# Patient Record
Sex: Female | Born: 1980 | Race: Black or African American | Hispanic: No | Marital: Married | Smoking: Never smoker
Health system: Southern US, Community
[De-identification: ages and names within clinical notes are randomized; demographics above are authoritative.]

## PROBLEM LIST (undated history)

## (undated) ENCOUNTER — Inpatient Hospital Stay (HOSPITAL_COMMUNITY): Payer: Self-pay

## (undated) DIAGNOSIS — K8042 Calculus of bile duct with acute cholecystitis without obstruction: Secondary | ICD-10-CM

## (undated) DIAGNOSIS — G43909 Migraine, unspecified, not intractable, without status migrainosus: Secondary | ICD-10-CM

## (undated) HISTORY — DX: Migraine, unspecified, not intractable, without status migrainosus: G43.909

## (undated) HISTORY — DX: Calculus of bile duct with acute cholecystitis without obstruction: K80.42

---

## 2012-04-05 NOTE — L&D Delivery Note (Signed)
Delivery Note At 9:34 PM a viable female was delivered via Vaginal, Spontaneous Delivery (Presentation: Left Occiput Anterior).  APGAR: 9, 9; weight 7 lb 9.2 oz (3435 g).   Placenta status: intact, Spontaneous.  Cord: 3 vessels with the following complications: None.  Cord pH: not sent.  Infant taken to warmer to be cleaned prior to mother holding.    Anesthesia: Local  Episiotomy: None Lacerations: 1st degree;Perineal Suture Repair: 3.0 vicryl Est. Blood Loss (mL): 200  Mom to postpartum.  Baby to nursery-stable.  Selena Lesser 01/03/2013, 10:01 PM

## 2012-04-05 NOTE — L&D Delivery Note (Signed)
I have seen and examined this patient and I agree with the above. Cam Hai 3:11 AM 01/04/2013

## 2012-06-07 ENCOUNTER — Encounter (HOSPITAL_COMMUNITY): Payer: Self-pay

## 2012-06-07 ENCOUNTER — Inpatient Hospital Stay (HOSPITAL_COMMUNITY)
Admission: AD | Admit: 2012-06-07 | Discharge: 2012-06-07 | Disposition: A | Discharge status: Home / Self Care | Payer: Medicaid Other | Source: Ambulatory Visit | Attending: Obstetrics & Gynecology | Admitting: Obstetrics & Gynecology

## 2012-06-07 DIAGNOSIS — O219 Vomiting of pregnancy, unspecified: Secondary | ICD-10-CM

## 2012-06-07 DIAGNOSIS — R51 Headache: Secondary | ICD-10-CM | POA: Insufficient documentation

## 2012-06-07 DIAGNOSIS — O210 Mild hyperemesis gravidarum: Secondary | ICD-10-CM | POA: Insufficient documentation

## 2012-06-07 LAB — URINALYSIS, ROUTINE W REFLEX MICROSCOPIC
Bilirubin Urine: NEGATIVE
Glucose, UA: NEGATIVE mg/dL
Ketones, ur: NEGATIVE mg/dL
pH: 6 (ref 5.0–8.0)

## 2012-06-07 LAB — URINE MICROSCOPIC-ADD ON

## 2012-06-07 MED ORDER — ONDANSETRON 8 MG PO TBDP: 8.0000 mg | ORAL_TABLET | Freq: Once | ORAL | Status: DC

## 2012-06-07 MED ORDER — PROMETHAZINE HCL 12.5 MG PO TABS: 12.5000 mg | ORAL_TABLET | Freq: Four times a day (QID) | ORAL | Status: DC | PRN

## 2012-06-07 MED ORDER — ONDANSETRON 8 MG PO TBDP
8.0000 mg | ORAL_TABLET | Freq: Once | ORAL | Status: AC
  Administered 2012-06-07: 8 mg via ORAL
  Filled 2012-06-07: qty 1

## 2012-06-07 NOTE — MAU Provider Note (Signed)
History     CSN: [id number]  Arrival date and time: 06/07/12 4098   First Provider Initiated Contact with Patient 06/07/12 1041      Chief Complaint  Patient presents with  . Emesis  . Headache   HPI Kristen Shaw is 32 y.o. [id number] [redacted]w[redacted]d weeks presenting with nausea and vomiting during this pregnancy.  She is "tired of it" She also has a headache because she has been unable eat since yesterday.  A friend has been giving her medication for nausea but she does not know the name of the medication.  She has hx of back pain which has worsened with pregnancy.  She denies abdominal pain and vaginal bleeding/discharge.  She needs list of doctors for prenatal care.   History reviewed. No pertinent past medical history.  History reviewed. No pertinent past surgical history.  History reviewed. No pertinent family history.  History  Substance Use Topics  . Smoking status: Never Smoker   . Smokeless tobacco: Not on file  . Alcohol Use: No    Allergies: No Known Allergies  Prescriptions prior to admission  Medication Sig Dispense Refill  . FOLIC ACID PO Take 1 tablet by mouth.        Review of Systems  Constitutional:       Unable to eat  Gastrointestinal: Positive for nausea and vomiting. Negative for abdominal pain.  Genitourinary:       Neg for vaginal bleeding or discharge  Musculoskeletal: Positive for back pain.  Neurological: Positive for headaches.   Physical Exam   Blood pressure 101/66, pulse 68, temperature 98.2 F (36.8 C), temperature source Oral, resp. rate 18, height 5\' 4"  (1.626 m), weight 161 lb (73.029 kg), last menstrual period 03/28/2012.  Physical Exam  Constitutional: She is oriented to person, place, and time. She appears well-developed and well-nourished. No distress.  HENT:  Head: Normocephalic.  Neck: Normal range of motion.  Cardiovascular: Normal rate.   Respiratory: Effort normal.  GI: Soft. There is tenderness (mild mid abdominal pain).  There is no rebound and no guarding.  Genitourinary:  Not indicated  Neurological: She is alert and oriented to person, place, and time.  Skin: Skin is warm and dry.  Psychiatric: She has a normal mood and affect. Her behavior is normal.   Results for orders placed during the hospital encounter of 06/07/12 (from the past 24 hour(s))  URINALYSIS, ROUTINE W REFLEX MICROSCOPIC     Status: Abnormal   Collection Time    06/07/12 10:10 AM      Result Value Range   Color, Urine YELLOW  YELLOW   APPearance CLEAR  CLEAR   Specific Gravity, Urine 1.020  1.005 - 1.030   pH 6.0  5.0 - 8.0   Glucose, UA NEGATIVE  NEGATIVE mg/dL   Hgb urine dipstick TRACE (*) NEGATIVE   Bilirubin Urine NEGATIVE  NEGATIVE   Ketones, ur NEGATIVE  NEGATIVE mg/dL   Protein, ur NEGATIVE  NEGATIVE mg/dL   Urobilinogen, UA 0.2  0.0 - 1.0 mg/dL   Nitrite NEGATIVE  NEGATIVE   Leukocytes, UA TRACE (*) NEGATIVE  URINE MICROSCOPIC-ADD ON     Status: Abnormal   Collection Time    06/07/12 10:10 AM      Result Value Range   Squamous Epithelial / LPF FEW (*) RARE   WBC, UA 0-2  <3 WBC/hpf   RBC / HPF 0-2  <3 RBC/hpf   Bacteria, UA RARE  RARE  POCT PREGNANCY, URINE  Status: Abnormal   Collection Time    06/07/12 10:18 AM      Result Value Range   Preg Test, Ur POSITIVE (*) NEGATIVE   MAU Course  Procedures  MDM   Used phone translator for HPI and discussed treatment of nausea and plans for prenatal care Will give Zofran 8mg  ODT now.    Assessment and Plan  A:  Nausea and vomiting in pregnancy     Headache  P:  Rx for phenergan tabs prn nausea/vomiting     List of clinics given     Discussed small frequent meals and use of tylenol prn for headaces    KEY,EVE M 06/07/2012, 10:41 AM

## 2012-06-07 NOTE — MAU Note (Signed)
I am 2 months preg, has been very nauseous.  Vomits everything she eats. Having headaches all the time.

## 2012-06-07 NOTE — MAU Note (Signed)
Has not taken anything for back pain or headaches.  Had back pain prior to pregnancy, worse now with preg "very severe"

## 2012-06-08 NOTE — MAU Provider Note (Signed)
Attestation of Attending Supervision of Advanced Practitioner (PA/CNM/NP): Evaluation and management procedures were performed by the Advanced Practitioner under my supervision and collaboration.  I have reviewed the Advanced Practitioner's note and chart, and I agree with the management and plan.  Anastasiya Gowin, MD, FACOG Attending Obstetrician & Gynecologist Faculty Practice, [facility]  

## 2012-07-12 ENCOUNTER — Encounter: Payer: Self-pay | Admitting: Family Medicine

## 2012-07-12 ENCOUNTER — Ambulatory Visit (INDEPENDENT_AMBULATORY_CARE_PROVIDER_SITE_OTHER): Payer: Medicaid Other | Admitting: Family Medicine

## 2012-07-12 ENCOUNTER — Other Ambulatory Visit: Payer: Self-pay | Admitting: Family Medicine

## 2012-07-12 ENCOUNTER — Other Ambulatory Visit (HOSPITAL_COMMUNITY)
Admission: RE | Admit: 2012-07-12 | Discharge: 2012-07-12 | Disposition: A | Discharge status: Home / Self Care | Payer: Medicaid Other | Source: Ambulatory Visit | Attending: Family Medicine | Admitting: Family Medicine

## 2012-07-12 VITALS — BP 98/64 | Temp 97.9°F | Wt 158.9 lb

## 2012-07-12 DIAGNOSIS — Z3480 Encounter for supervision of other normal pregnancy, unspecified trimester: Secondary | ICD-10-CM

## 2012-07-12 DIAGNOSIS — Z01419 Encounter for gynecological examination (general) (routine) without abnormal findings: Secondary | ICD-10-CM | POA: Insufficient documentation

## 2012-07-12 DIAGNOSIS — O9989 Other specified diseases and conditions complicating pregnancy, childbirth and the puerperium: Secondary | ICD-10-CM

## 2012-07-12 DIAGNOSIS — M545 Low back pain: Secondary | ICD-10-CM

## 2012-07-12 DIAGNOSIS — Z113 Encounter for screening for infections with a predominantly sexual mode of transmission: Secondary | ICD-10-CM | POA: Insufficient documentation

## 2012-07-12 DIAGNOSIS — O210 Mild hyperemesis gravidarum: Secondary | ICD-10-CM

## 2012-07-12 DIAGNOSIS — O219 Vomiting of pregnancy, unspecified: Secondary | ICD-10-CM

## 2012-07-12 DIAGNOSIS — Z3401 Encounter for supervision of normal first pregnancy, first trimester: Secondary | ICD-10-CM

## 2012-07-12 DIAGNOSIS — O0930 Supervision of pregnancy with insufficient antenatal care, unspecified trimester: Secondary | ICD-10-CM | POA: Insufficient documentation

## 2012-07-12 DIAGNOSIS — Z3492 Encounter for supervision of normal pregnancy, unspecified, second trimester: Secondary | ICD-10-CM

## 2012-07-12 DIAGNOSIS — R87810 Cervical high risk human papillomavirus (HPV) DNA test positive: Secondary | ICD-10-CM | POA: Insufficient documentation

## 2012-07-12 DIAGNOSIS — G43909 Migraine, unspecified, not intractable, without status migrainosus: Secondary | ICD-10-CM

## 2012-07-12 DIAGNOSIS — Z1151 Encounter for screening for human papillomavirus (HPV): Secondary | ICD-10-CM | POA: Insufficient documentation

## 2012-07-12 MED ORDER — BUTALBITAL-APAP-CAFFEINE 50-500-40 MG PO TABS: 1.0000 | ORAL_TABLET | ORAL | Status: DC | PRN

## 2012-07-12 MED ORDER — PROMETHAZINE HCL 12.5 MG PO TABS: 12.5000 mg | ORAL_TABLET | Freq: Four times a day (QID) | ORAL | Status: DC | PRN

## 2012-07-12 MED ORDER — ONDANSETRON 4 MG PO TBDP: 4.0000 mg | ORAL_TABLET | Freq: Four times a day (QID) | ORAL | Status: DC | PRN

## 2012-07-12 NOTE — Patient Instructions (Addendum)
Pregnancy - Second Trimester The second trimester of pregnancy (3 to 6 months) is a period of rapid growth for you and your baby. At the end of the sixth month, your baby is about 9 inches long and weighs 1 1/2 pounds. You will begin to feel the baby move between 18 and 20 weeks of the pregnancy. This is called quickening. Weight gain is faster. A clear fluid (colostrum) may leak out of your breasts. You may feel small contractions of the womb (uterus). This is known as false labor or Braxton-Hicks contractions. This is like a practice for labor when the baby is ready to be born. Usually, the problems with morning sickness have usually passed by the end of your first trimester. Some women develop small dark blotches (called cholasma, mask of pregnancy) on their face that usually goes away after the baby is born. Exposure to the sun makes the blotches worse. Acne may also develop in some pregnant women and pregnant women who have acne, may find that it goes away. PRENATAL EXAMS  Blood work may continue to be done during prenatal exams. These tests are done to check on your health and the probable health of your baby. Blood work is used to follow your blood levels (hemoglobin). Anemia (low hemoglobin) is common during pregnancy. Iron and vitamins are given to help prevent this. You will also be checked for diabetes between 24 and 28 weeks of the pregnancy. Some of the previous blood tests may be repeated.  The size of the uterus is measured during each visit. This is to make sure that the baby is continuing to grow properly according to the dates of the pregnancy.  Your blood pressure is checked every prenatal visit. This is to make sure you are not getting toxemia.  Your urine is checked to make sure you do not have an infection, diabetes or protein in the urine.  Your weight is checked often to make sure gains are happening at the suggested rate. This is to ensure that both you and your baby are growing  normally.  Sometimes, an ultrasound is performed to confirm the proper growth and development of the baby. This is a test which bounces harmless sound waves off the baby so your caregiver can more accurately determine due dates. Sometimes, a specialized test is done on the amniotic fluid surrounding the baby. This test is called an amniocentesis. The amniotic fluid is obtained by sticking a needle into the belly (abdomen). This is done to check the chromosomes in instances where there is a concern about possible genetic problems with the baby. It is also sometimes done near the end of pregnancy if an early delivery is required. In this case, it is done to help make sure the baby's lungs are mature enough for the baby to live outside of the womb. CHANGES OCCURING IN THE SECOND TRIMESTER OF PREGNANCY Your body goes through many changes during pregnancy. They vary from person to person. Talk to your caregiver about changes you notice that you are concerned about.  During the second trimester, you will likely have an increase in your appetite. It is normal to have cravings for certain foods. This varies from person to person and pregnancy to pregnancy.  Your lower abdomen will begin to bulge.  You may have to urinate more often because the uterus and baby are pressing on your bladder. It is also common to get more bladder infections during pregnancy (pain with urination). You can help this by   drinking lots of fluids and emptying your bladder before and after intercourse.  You may begin to get stretch marks on your hips, abdomen, and breasts. These are normal changes in the body during pregnancy. There are no exercises or medications to take that prevent this change.  You may begin to develop swollen and bulging veins (varicose veins) in your legs. Wearing support hose, elevating your feet for 15 minutes, 3 to 4 times a day and limiting salt in your diet helps lessen the problem.  Heartburn may develop  as the uterus grows and pushes up against the stomach. Antacids recommended by your caregiver helps with this problem. Also, eating smaller meals 4 to 5 times a day helps.  Constipation can be treated with a stool softener or adding bulk to your diet. Drinking lots of fluids, vegetables, fruits, and whole grains are helpful.  Exercising is also helpful. If you have been very active up until your pregnancy, most of these activities can be continued during your pregnancy. If you have been less active, it is helpful to start an exercise program such as walking.  Hemorrhoids (varicose veins in the rectum) may develop at the end of the second trimester. Warm sitz baths and hemorrhoid cream recommended by your caregiver helps hemorrhoid problems.  Backaches may develop during this time of your pregnancy. Avoid heavy lifting, wear low heal shoes and practice good posture to help with backache problems.  Some pregnant women develop tingling and numbness of their hand and fingers because of swelling and tightening of ligaments in the wrist (carpel tunnel syndrome). This goes away after the baby is born.  As your breasts enlarge, you may have to get a bigger bra. Get a comfortable, cotton, support bra. Do not get a nursing bra until the last month of the pregnancy if you will be nursing the baby.  You may get a dark line from your belly button to the pubic area called the linea nigra.  You may develop rosy cheeks because of increase blood flow to the face.  You may develop spider looking lines of the face, neck, arms and chest. These go away after the baby is born. HOME CARE INSTRUCTIONS   It is extremely important to avoid all smoking, herbs, alcohol, and unprescribed drugs during your pregnancy. These chemicals affect the formation and growth of the baby. Avoid these chemicals throughout the pregnancy to ensure the delivery of a healthy infant.  Most of your home care instructions are the same as  suggested for the first trimester of your pregnancy. Keep your caregiver's appointments. Follow your caregiver's instructions regarding medication use, exercise and diet.  During pregnancy, you are providing food for you and your baby. Continue to eat regular, well-balanced meals. Choose foods such as meat, fish, milk and other low fat dairy products, vegetables, fruits, and whole-grain breads and cereals. Your caregiver will tell you of the ideal weight gain.  A physical sexual relationship may be continued up until near the end of pregnancy if there are no other problems. Problems could include early (premature) leaking of amniotic fluid from the membranes, vaginal bleeding, abdominal pain, or other medical or pregnancy problems.  Exercise regularly if there are no restrictions. Check with your caregiver if you are unsure of the safety of some of your exercises. The greatest weight gain will occur in the last 2 trimesters of pregnancy. Exercise will help you:  Control your weight.  Get you in shape for labor and delivery.  Lose weight   after you have the baby.  Wear a good support or jogging bra for breast tenderness during pregnancy. This may help if worn during sleep. Pads or tissues may be used in the bra if you are leaking colostrum.  Do not use hot tubs, steam rooms or saunas throughout the pregnancy.  Wear your seat belt at all times when driving. This protects you and your baby if you are in an accident.  Avoid raw meat, uncooked cheese, cat litter boxes and soil used by cats. These carry germs that can cause birth defects in the baby.  The second trimester is also a good time to visit your dentist for your dental health if this has not been done yet. Getting your teeth cleaned is OK. Use a soft toothbrush. Brush gently during pregnancy.  It is easier to loose urine during pregnancy. Tightening up and strengthening the pelvic muscles will help with this problem. Practice stopping your  urination while you are going to the bathroom. These are the same muscles you need to strengthen. It is also the muscles you would use as if you were trying to stop from passing gas. You can practice tightening these muscles up 10 times a set and repeating this about 3 times per day. Once you know what muscles to tighten up, do not perform these exercises during urination. It is more likely to contribute to an infection by backing up the urine.  Ask for help if you have financial, counseling or nutritional needs during pregnancy. Your caregiver will be able to offer counseling for these needs as well as refer you for other special needs.  Your skin may become oily. If so, wash your face with mild soap, use non-greasy moisturizer and oil or cream based makeup. MEDICATIONS AND DRUG USE IN PREGNANCY  Take prenatal vitamins as directed. The vitamin should contain 1 milligram of folic acid. Keep all vitamins out of reach of children. Only a couple vitamins or tablets containing iron may be fatal to a baby or young child when ingested.  Avoid use of all medications, including herbs, over-the-counter medications, not prescribed or suggested by your caregiver. Only take over-the-counter or prescription medicines for pain, discomfort, or fever as directed by your caregiver. Do not use aspirin.  Let your caregiver also know about herbs you may be using.  Alcohol is related to a number of birth defects. This includes fetal alcohol syndrome. All alcohol, in any form, should be avoided completely. Smoking will cause low birth rate and premature babies.  Street or illegal drugs are very harmful to the baby. They are absolutely forbidden. A baby born to an addicted mother will be addicted at birth. The baby will go through the same withdrawal an adult does. SEEK MEDICAL CARE IF:  You have any concerns or worries during your pregnancy. It is better to call with your questions if you feel they cannot wait, rather  than worry about them. SEEK IMMEDIATE MEDICAL CARE IF:   An unexplained oral temperature above 102 F (38.9 C) develops, or as your caregiver suggests.  You have leaking of fluid from the vagina (birth canal). If leaking membranes are suspected, take your temperature and tell your caregiver of this when you call.  There is vaginal spotting, bleeding, or passing clots. Tell your caregiver of the amount and how many pads are used. Light spotting in pregnancy is common, especially following intercourse.  You develop a bad smelling vaginal discharge with a change in the color from clear   to white.  You continue to feel sick to your stomach (nauseated) and have no relief from remedies suggested. You vomit blood or coffee ground-like materials.  You lose more than 2 pounds of weight or gain more than 2 pounds of weight over 1 week, or as suggested by your caregiver.  You notice swelling of your face, hands, feet, or legs.  You get exposed to [place] measles and have never had them.  You are exposed to fifth disease or chickenpox.  You develop belly (abdominal) pain. Round ligament discomfort is a common non-cancerous (benign) cause of abdominal pain in pregnancy. Your caregiver still must evaluate you.  You develop a bad headache that does not go away.  You develop fever, diarrhea, pain with urination, or shortness of breath.  You develop visual problems, blurry, or double vision.  You fall or are in a car accident or any kind of trauma.  There is mental or physical violence at home. Document Released: 03/16/2001 Document Revised: 06/14/2011 Document Reviewed: 09/18/2008 [facility] Patient Information 2013 [place], [place].  Back Pain in Pregnancy Back pain during pregnancy is common. It happens in about half of all pregnancies. It is important for you and your baby that you remain active during your pregnancy.If you feel that back pain is not allowing you to remain active or sleep well,  it is time to see your caregiver. Back pain may be caused by several factors related to changes during your pregnancy.Fortunately, unless you had trouble with your back before your pregnancy, the pain is likely to get better after you deliver. Low back pain usually occurs between the fifth and seventh months of pregnancy. It can, however, happen in the first couple months. Factors that increase the risk of back problems include:   Previous back problems.  Injury to your back.  Having twins or multiple births.  A chronic cough.  Stress.  Job-related repetitive motions.  Muscle or spinal disease in the back.  Family history of back problems, ruptured (herniated) discs, or osteoporosis.  Depression, anxiety, and panic attacks. CAUSES   When you are pregnant, your body produces a hormone called relaxin. This hormonemakes the ligaments connecting the low back and pubic bones more flexible. This flexibility allows the baby to be delivered more easily. When your ligaments are loose, your muscles need to work harder to support your back. Soreness in your back can come from tired muscles. Soreness can also come from back tissues that are irritated since they are receiving less support.  As the baby grows, it puts pressure on the nerves and blood vessels in your pelvis. This can cause back pain.  As the baby grows and gets heavier during pregnancy, the uterus pushes the stomach muscles forward and changes your center of gravity. This makes your back muscles work harder to maintain good posture. SYMPTOMS  Lumbar pain during pregnancy Lumbar pain during pregnancy usually occurs at or above the waist in the center of the back. There may be pain and numbness that radiates into your leg or foot. This is similar to low back pain experienced by non-pregnant women. It usually increases with sitting for long periods of time, standing, or repetitive lifting. Tenderness may also be present in the muscles  along your upper back. Posterior pelvic pain during pregnancy Pain in the back of the pelvis is more common than lumbar pain in pregnancy. It is a deep pain felt in your side at the waistline, or across the tailbone (sacrum), or in both  places. You may have pain on one or both sides. This pain can also go into the buttocks and backs of the upper thighs. Pubic and groin pain may also be present. The pain does not quickly resolve with rest, and morning stiffness may also be present. Pelvic pain during pregnancy can be brought on by most activities. A high level of fitness before and during pregnancy may or may not prevent this problem. Labor pain is usually 1 to 2 minutes apart, lasts for about 1 minute, and involves a bearing down feeling or pressure in your pelvis. However, if you are at term with the pregnancy, constant low back pain can be the beginning of early labor, and you should be aware of this. DIAGNOSIS  X-rays of the back should not be done during the first 12 to 14 weeks of the pregnancy and only when absolutely necessary during the rest of the pregnancy. MRIs do not give off radiation and are safe during pregnancy. MRIs also should only be done when absolutely necessary. HOME CARE INSTRUCTIONS  Exercise as directed by your caregiver. Exercise is the most effective way to prevent or manage back pain. If you have a back problem, it is especially important to avoid sports that require sudden body movements. Swimming and walking are great activities.  Do not stand in one place for long periods of time.  Do not wear high heels.  Sit in chairs with good posture. Use a pillow on your lower back if necessary. Make sure your head rests over your shoulders and is not hanging forward.  Try sleeping on your side, preferably the left side, with a pillow or two between your legs. If you are sore after a night's rest, your bedmay betoo soft.Try placing a board between your mattress and box  spring.  Listen to your body when lifting.If you are experiencing pain, ask for help or try bending yourknees more so you can use your leg muscles rather than your back muscles. Squat down when picking up something from the floor. Do not bend over.  Eat a healthy diet. Try to gain weight within your caregiver's recommendations.  Use heat or cold packs 3 to 4 times a day for 15 minutes to help with the pain.  Only take over-the-counter or prescription medicines for pain, discomfort, or fever as directed by your caregiver. Sudden (acute) back pain  Use bed rest for only the most extreme, acute episodes of back pain. Prolonged bed rest over 48 hours will aggravate your condition.  Ice is very effective for acute conditions.  Put ice in a plastic bag.  Place a towel between your skin and the bag.  Leave the ice on for 10 to 20 minutes every 2 hours, or as needed.  Using heat packs for 30 minutes prior to activities is also helpful. Continued back pain See your caregiver if you have continued problems. Your caregiver can help or refer you for appropriate physical therapy. With conditioning, most back problems can be avoided. Sometimes, a more serious issue may be the cause of back pain. You should be seen right away if new problems seem to be developing. Your caregiver may recommend:  A maternity girdle.  An elastic sling.  A back brace.  A massage therapist or acupuncture. SEEK MEDICAL CARE IF:   You are not able to do most of your daily activities, even when taking the pain medicine you were given.  You need a referral to a physical therapist or chiropractor.  You want to try acupuncture. SEEK IMMEDIATE MEDICAL CARE IF:  You develop numbness, tingling, weakness, or problems with the use of your arms or legs.  You develop severe back pain that is no longer relieved with medicines.  You have a sudden change in bowel or bladder control.  You have increasing pain in other  areas of the body.  You develop shortness of breath, dizziness, or fainting.  You develop nausea, vomiting, or sweating.  You have back pain which is similar to labor pains.  You have back pain along with your water breaking or vaginal bleeding.  You have back pain or numbness that travels down your leg.  Your back pain developed after you fell.  You develop pain on one side of your back. You may have a kidney stone.  You see blood in your urine. You may have a bladder infection or kidney stone.  You have back pain with blisters. You may have shingles. Back pain is fairly common during pregnancy but should not be accepted as just part of the process. Back pain should always be treated as soon as possible. This will make your pregnancy as pleasant as possible. Document Released: 06/30/2005 Document Revised: 06/14/2011 Document Reviewed: 08/11/2010 [facility] Patient Information 2013 [place], [place].  Migraine Headache A migraine headache is an intense, throbbing pain on one or both sides of your head. A migraine can last for 30 minutes to several hours. CAUSES  The exact cause of a migraine headache is not always known. However, a migraine may be caused when nerves in the brain become irritated and release chemicals that cause inflammation. This causes pain. SYMPTOMS  Pain on one or both sides of your head.  Pulsating or throbbing pain.  Severe pain that prevents daily activities.  Pain that is aggravated by any physical activity.  Nausea, vomiting, or both.  Dizziness.  Pain with exposure to bright lights, loud noises, or activity.  General sensitivity to bright lights, loud noises, or smells. Before you get a migraine, you may get warning signs that a migraine is coming (aura). An aura may include:  Seeing flashing lights.  Seeing bright spots, halos, or zig-zag lines.  Having tunnel vision or blurred vision.  Having feelings of numbness or tingling.  Having  trouble talking.  Having muscle weakness. MIGRAINE TRIGGERS  Alcohol.  Smoking.  Stress.  Menstruation.  Aged cheeses.  Foods or drinks that contain nitrates, glutamate, aspartame, or tyramine.  Lack of sleep.  Chocolate.  Caffeine.  Hunger.  Physical exertion.  Fatigue.  Medicines used to treat chest pain (nitroglycerine), birth control pills, estrogen, and some blood pressure medicines. DIAGNOSIS  A migraine headache is often diagnosed based on:  Symptoms.  Physical examination.  A CT scan or MRI of your head. TREATMENT Medicines may be given for pain and nausea. Medicines can also be given to help prevent recurrent migraines.  HOME CARE INSTRUCTIONS  Only take over-the-counter or prescription medicines for pain or discomfort as directed by your caregiver. The use of long-term narcotics is not recommended.  Lie down in a dark, quiet room when you have a migraine.  Keep a journal to find out what may trigger your migraine headaches. For example, write down:  What you eat and drink.  How much sleep you get.  Any change to your diet or medicines.  Limit alcohol consumption.  Quit smoking if you smoke.  Get 7 to 9 hours of sleep, or as recommended by your caregiver.  Limit stress.  Keep lights dim if bright lights bother you and make your migraines worse. SEEK IMMEDIATE MEDICAL CARE IF:   Your migraine becomes severe.  You have a fever.  You have a stiff neck.  You have vision loss.  You have muscular weakness or loss of muscle control.  You start losing your balance or have trouble walking.  You feel faint or pass out.  You have severe symptoms that are different from your first symptoms. MAKE SURE YOU:   Understand these instructions.  Will watch your condition.  Will get help right away if you are not doing well or get worse. Document Released: 03/22/2005 Document Revised: 06/14/2011 Document Reviewed: 03/12/2011 [facility]  Patient Information 2013 [place], [place].

## 2012-07-12 NOTE — Progress Notes (Signed)
Pt. C/o back pain 

## 2012-07-12 NOTE — Progress Notes (Signed)
  Subjective:    Kristen Shaw is being seen today for her first obstetrical visit.  This is not a planned pregnancy. She moved from [place] (originally from [place]) 3 months ago and was unable to obtain contraception due to lack of insurance and medical care. She is at [redacted]w[redacted]d gestation by her LMP (approx 03/28/12). Her obstetrical history is significant for no risk factors. Relationship with FOB: spouse, living together. Patient does intend to breast feed. Pregnancy history fully reviewed.   Patient reports backache, headache, nausea, no bleeding, no contractions, no cramping, no leaking and vomiting. Seen in MAU for nausea/vomiting on 06/07/12 and given phenergan. It does not help for long - only a few hours. Not able to keep much food down. Denies heartburn, diarrhea or constipation. Has hx of migraines and having daily headache, left sided, associated with nausea, no vision changes. Tylenol not working. Also having low back pain, worsening over last few months.  This is 4th pregnancy. No complications of prior pregnancies or deliveries. All babies weighed 3.5-4.0 kg.  Past medical, surgical, OB, family and social history reviewed. Medications and allergies reviewed. Review of Systems:   Review of Systems Review of Systems - Negative except as stated in HPI  Objective:     BP 98/64  Temp(Src) 97.9 F (36.6 C)  Wt 158 lb 14.4 oz (72.077 kg)  BMI 27.26 kg/m2  LMP 03/28/2012 Physical Exam  Exam Physical Examination: General appearance - alert, well appearing, and in no distress Mental status - normal mood, behavior, speech, dress, motor activity, and thought processes Eyes - pupils equal and reactive, extraocular eye movements intact Neck - supple, no significant adenopathy, thyroid exam: thyroid is normal in size without nodules or tenderness Chest - clear to auscultation, no wheezes, rales or rhonchi, symmetric air entry Heart - normal rate, regular rhythm, normal S1, S2, no murmurs,  rubs, clicks or gallops Abdomen - soft, gravid (fundal height below umbilicus), non-tender, normal bowel sounds Pelvic - normal external genitalia, vulva, vagina, cervix, uterus and adnexa Neurological - alert, oriented, normal speech, no focal findings or movement disorder noted Extremities - peripheral pulses normal, no pedal edema, no clubbing or cyanosis   Assessment:    Pregnancy: [id number] Patient Active Problem List  Diagnosis  . Encounter for supervision of normal pregnancy in second trimester  . Migraines  . Pregnancy related low back pain, antepartum  . Nausea and vomiting in pregnancy prior to [redacted] weeks gestation       Plan:    Initial labs drawn. Prenatal vitamins. Problem list reviewed and updated. Zofran/phenergan for nausea Fioricet for headaches Discussed good back posture, exercises AFP3 discussed: requested. Quad screen ordered. Role of ultrasound in pregnancy discussed; fetal survey: requested. Amniocentesis discussed: not indicated. Follow up in 4 weeks. 50% of 45 min visit spent on counseling and coordination of care.   Napoleon Form 07/12/2012

## 2012-07-12 NOTE — Assessment & Plan Note (Signed)
Fioricet prescribed 07/12/12

## 2012-07-13 LAB — OBSTETRIC PANEL
Basophils Absolute: 0 10*3/uL (ref 0.0–0.1)
Basophils Relative: 0 % (ref 0–1)
Eosinophils Absolute: 0.1 10*3/uL (ref 0.0–0.7)
Hepatitis B Surface Ag: NEGATIVE
Lymphs Abs: 1.8 10*3/uL (ref 0.7–4.0)
MCH: 25.6 pg — ABNORMAL LOW (ref 26.0–34.0)
Neutrophils Relative %: 60 % (ref 43–77)
Platelets: 329 10*3/uL (ref 150–400)
RBC: 4.29 MIL/uL (ref 3.87–5.11)
WBC: 6 10*3/uL (ref 4.0–10.5)

## 2012-07-14 LAB — HEMOGLOBINOPATHY EVALUATION
Hemoglobin Other: 0 %
Hgb A2 Quant: 2.8 % (ref 2.2–3.2)
Hgb A: 96.9 % (ref 96.8–97.8)
Hgb F Quant: 0.3 % (ref 0.0–2.0)
Hgb S Quant: 0 %

## 2012-07-19 ENCOUNTER — Encounter: Payer: Self-pay | Admitting: *Deleted

## 2012-07-23 ENCOUNTER — Encounter: Payer: Self-pay | Admitting: Family Medicine

## 2012-07-27 ENCOUNTER — Ambulatory Visit: Payer: Self-pay | Admitting: Obstetrics & Gynecology

## 2012-07-28 ENCOUNTER — Encounter: Payer: Self-pay | Admitting: Obstetrics & Gynecology

## 2012-07-28 NOTE — Progress Notes (Signed)
Pt not seen.

## 2012-08-02 ENCOUNTER — Other Ambulatory Visit (INDEPENDENT_AMBULATORY_CARE_PROVIDER_SITE_OTHER): Payer: Medicaid Other | Admitting: *Deleted

## 2012-08-02 ENCOUNTER — Ambulatory Visit (HOSPITAL_COMMUNITY)
Admission: RE | Admit: 2012-08-02 | Discharge: 2012-08-02 | Disposition: A | Discharge status: Home / Self Care | Payer: Medicaid Other | Source: Ambulatory Visit | Attending: Family Medicine | Admitting: Family Medicine

## 2012-08-02 DIAGNOSIS — Z3401 Encounter for supervision of normal first pregnancy, first trimester: Secondary | ICD-10-CM

## 2012-08-02 DIAGNOSIS — O358XX0 Maternal care for other (suspected) fetal abnormality and damage, not applicable or unspecified: Secondary | ICD-10-CM | POA: Insufficient documentation

## 2012-08-02 DIAGNOSIS — Z1389 Encounter for screening for other disorder: Secondary | ICD-10-CM | POA: Insufficient documentation

## 2012-08-02 DIAGNOSIS — Z363 Encounter for antenatal screening for malformations: Secondary | ICD-10-CM | POA: Insufficient documentation

## 2012-08-02 DIAGNOSIS — O210 Mild hyperemesis gravidarum: Secondary | ICD-10-CM

## 2012-08-02 NOTE — Progress Notes (Signed)
Patient here for quad screen but per chart review has already been done- was normal- and does not need to be repeated. Verified with another nurse. Instructed patient we do not need to do quad screen since it has already been done. Also noted patient was also seeing another OB/GYN - she states she did but she has decided to continue with our clinic only. Patient voices understanding. Used Interpreter today.

## 2012-08-06 ENCOUNTER — Encounter: Payer: Self-pay | Admitting: Family Medicine

## 2012-08-09 ENCOUNTER — Ambulatory Visit (INDEPENDENT_AMBULATORY_CARE_PROVIDER_SITE_OTHER): Payer: Medicaid Other | Admitting: Advanced Practice Midwife

## 2012-08-09 VITALS — BP 95/67 | Temp 97.6°F | Wt 162.1 lb

## 2012-08-09 DIAGNOSIS — O219 Vomiting of pregnancy, unspecified: Secondary | ICD-10-CM

## 2012-08-09 DIAGNOSIS — O210 Mild hyperemesis gravidarum: Secondary | ICD-10-CM

## 2012-08-09 LAB — POCT URINALYSIS DIP (DEVICE)
Leukocytes, UA: NEGATIVE
Nitrite: NEGATIVE
Protein, ur: NEGATIVE mg/dL
Urobilinogen, UA: 0.2 mg/dL (ref 0.0–1.0)

## 2012-08-09 NOTE — Patient Instructions (Addendum)
Pregnancy - Second Trimester The second trimester is the period between 13 to 27 weeks of your pregnancy. It is important to follow your doctor's instructions. HOME CARE   Do not smoke.  Do not drink alcohol or use drugs.  Only take medicine as told by your doctor.  Take prenatal vitamins as told. The vitamin should contain 1 milligram of folic acid.  Exercise.  Eat healthy foods. Eat regular, well-balanced meals.  You can have sex (intercourse) if there are no other problems with the pregnancy.  Do not use hot tubs, steam rooms, or saunas.  Wear a seat belt while driving.  Avoid raw meat, uncooked cheese, and litter boxes and soil used by cats.  Visit your dentist. Shirlee Limerick are okay. GET HELP RIGHT AWAY IF:   You have a temperature by mouth above 102 F (38.9 C), not controlled by medicine.  Fluid is coming from your vagina.  Blood is coming from your vagina. Light spotting is common, especially after sex (intercourse).  You have a bad smelling fluid (discharge) coming from the vagina. The fluid changes from clear to white.  You still feel sick to your stomach (nauseous).  You throw up (vomit) blood.  You lose or gain more than 2 pounds (0.9 kilograms) of weight in a week, or as suggested by your doctor.  Your face, hands, feet, or legs get puffy (swell).  You get exposed to [place] measles and have never had them.  You get exposed to fifth disease or chickenpox.  You have belly (abdominal) pain.  You have a bad headache that will not go away.  You have watery poop (diarrhea), pain when you pee (urinate), or have shortness of breath.  You start to have problems seeing (blurry or double vision).  You fall, are in a car accident, or have any kind of trauma.  There is mental or physical violence at home.  You have any concerns or worries during your pregnancy. MAKE SURE YOU:   Understand these instructions.  Will watch your condition.  Will get help  right away if you are not doing well or get worse. Document Released: 06/16/2009 Document Revised: 06/14/2011 Document Reviewed: 06/16/2009 [facility] Patient Information 2013 [place], [place].  You are OK to take Tylenol (acetaminophen) in pregnancy.

## 2012-08-09 NOTE — Progress Notes (Signed)
Well, c/o some discomfort when the baby moves, but no regular pains, no bleeding. Rev'd precautions. Anatomy u/s and quad normal.

## 2012-08-09 NOTE — Progress Notes (Signed)
Pulse- 76  Pain-"cramping when baby moves"

## 2012-09-06 ENCOUNTER — Ambulatory Visit (INDEPENDENT_AMBULATORY_CARE_PROVIDER_SITE_OTHER): Payer: Medicaid Other | Admitting: Family

## 2012-09-06 VITALS — BP 93/61 | Temp 97.5°F | Wt 164.0 lb

## 2012-09-06 DIAGNOSIS — Z3492 Encounter for supervision of normal pregnancy, unspecified, second trimester: Secondary | ICD-10-CM

## 2012-09-06 DIAGNOSIS — O9989 Other specified diseases and conditions complicating pregnancy, childbirth and the puerperium: Secondary | ICD-10-CM

## 2012-09-06 LAB — POCT URINALYSIS DIP (DEVICE)
Glucose, UA: NEGATIVE mg/dL
Nitrite: NEGATIVE
Protein, ur: NEGATIVE mg/dL
Urobilinogen, UA: 0.2 mg/dL (ref 0.0–1.0)

## 2012-09-06 MED ORDER — CONCEPT DHA 53.5-38-1 MG PO CAPS: 1.0000 | ORAL_CAPSULE | Freq: Every day | ORAL | Status: DC

## 2012-09-06 NOTE — Progress Notes (Signed)
Informal US performed due to pt concern of not feeling FM.  Fetus in transverse, back down position. Gross body and limb movements visualized, fetal hiccups present and cardiac motion present. Pt watched [place] monitor during scan and felt reassured. Interpreter - Huntley Dec present during exam.

## 2012-09-06 NOTE — Progress Notes (Signed)
Pt concerned due to no fetal movement in two weeks; ultrasound with Diane to assess movement > growth [place] scheduled.

## 2012-09-06 NOTE — Progress Notes (Signed)
Pulse 81  States stopped feeling fetal movement since about 2 weeks ago. C/o lower back pain.

## 2012-09-08 ENCOUNTER — Ambulatory Visit (HOSPITAL_COMMUNITY)
Admission: RE | Admit: 2012-09-08 | Discharge: 2012-09-08 | Disposition: A | Discharge status: Home / Self Care | Payer: Medicaid Other | Source: Ambulatory Visit | Attending: Family | Admitting: Family

## 2012-09-08 DIAGNOSIS — Z3689 Encounter for other specified antenatal screening: Secondary | ICD-10-CM | POA: Insufficient documentation

## 2012-09-08 DIAGNOSIS — O3680X0 Pregnancy with inconclusive fetal viability, not applicable or unspecified: Secondary | ICD-10-CM | POA: Insufficient documentation

## 2012-09-08 DIAGNOSIS — Z3492 Encounter for supervision of normal pregnancy, unspecified, second trimester: Secondary | ICD-10-CM

## 2012-09-27 ENCOUNTER — Encounter: Payer: Medicaid Other | Admitting: Family Medicine

## 2012-10-11 ENCOUNTER — Ambulatory Visit (INDEPENDENT_AMBULATORY_CARE_PROVIDER_SITE_OTHER): Payer: Medicaid Other | Admitting: Family

## 2012-10-11 VITALS — BP 93/61 | Temp 97.8°F | Wt 166.0 lb

## 2012-10-11 DIAGNOSIS — O9989 Other specified diseases and conditions complicating pregnancy, childbirth and the puerperium: Secondary | ICD-10-CM

## 2012-10-11 DIAGNOSIS — Z3493 Encounter for supervision of normal pregnancy, unspecified, third trimester: Secondary | ICD-10-CM

## 2012-10-11 LAB — POCT URINALYSIS DIP (DEVICE)
Glucose, UA: NEGATIVE mg/dL
Nitrite: NEGATIVE
Protein, ur: NEGATIVE mg/dL
Urobilinogen, UA: 0.2 mg/dL (ref 0.0–1.0)

## 2012-10-11 LAB — GLUCOSE, CAPILLARY: Glucose-Capillary: 80 mg/dL (ref 70–99)

## 2012-10-11 NOTE — Progress Notes (Signed)
Pulse 81 Patient refuses 1 hour GCT for today states she is fasting for Ramadan. She agrees to do it on next OB visit.

## 2012-10-11 NOTE — Progress Notes (Signed)
No questions or concerns; pt declines 1 hr due to Ramadan; FBS today 80; repeat fasting at next visit in two weeks, then obtain 1 hr at the following visit.  Urine results not available at discharge.

## 2012-10-25 ENCOUNTER — Ambulatory Visit (INDEPENDENT_AMBULATORY_CARE_PROVIDER_SITE_OTHER): Payer: Medicaid Other | Admitting: Family

## 2012-10-25 VITALS — BP 101/63 | Temp 97.3°F | Wt 167.4 lb

## 2012-10-25 DIAGNOSIS — O9989 Other specified diseases and conditions complicating pregnancy, childbirth and the puerperium: Secondary | ICD-10-CM

## 2012-10-25 DIAGNOSIS — Z3492 Encounter for supervision of normal pregnancy, unspecified, second trimester: Secondary | ICD-10-CM

## 2012-10-25 LAB — POCT URINALYSIS DIP (DEVICE)
Bilirubin Urine: NEGATIVE
Glucose, UA: NEGATIVE mg/dL
Leukocytes, UA: NEGATIVE
Nitrite: NEGATIVE
Urobilinogen, UA: 0.2 mg/dL (ref 0.0–1.0)
pH: 7 (ref 5.0–8.0)

## 2012-10-25 NOTE — Progress Notes (Signed)
Pulse- 85  Pain-stomach

## 2012-10-25 NOTE — Progress Notes (Signed)
Pain on upper abdomen with fetal movement; no bleeding or leaking of fluid; provided reassurance; still fasting, will do FBS today (91) and 1 hr test in two weeks.

## 2012-11-08 ENCOUNTER — Encounter: Payer: Self-pay | Admitting: Advanced Practice Midwife

## 2012-12-01 ENCOUNTER — Ambulatory Visit (INDEPENDENT_AMBULATORY_CARE_PROVIDER_SITE_OTHER): Payer: Medicaid Other | Admitting: Obstetrics and Gynecology

## 2012-12-01 ENCOUNTER — Encounter: Payer: Self-pay | Admitting: Obstetrics and Gynecology

## 2012-12-01 VITALS — BP 88/63 | Temp 97.2°F | Wt 174.8 lb

## 2012-12-01 DIAGNOSIS — Z3483 Encounter for supervision of other normal pregnancy, third trimester: Secondary | ICD-10-CM

## 2012-12-01 DIAGNOSIS — O9989 Other specified diseases and conditions complicating pregnancy, childbirth and the puerperium: Secondary | ICD-10-CM

## 2012-12-01 LAB — POCT URINALYSIS DIP (DEVICE)
Bilirubin Urine: NEGATIVE
Glucose, UA: NEGATIVE mg/dL
Hgb urine dipstick: NEGATIVE
Nitrite: NEGATIVE
Urobilinogen, UA: 0.2 mg/dL (ref 0.0–1.0)
pH: 7 (ref 5.0–8.0)

## 2012-12-01 MED ORDER — CONCEPT DHA 53.5-38-1 MG PO CAPS: 1.0000 | ORAL_CAPSULE | Freq: Every day | ORAL | Status: DC

## 2012-12-01 NOTE — Progress Notes (Signed)
Pulse- 80 

## 2012-12-01 NOTE — Patient Instructions (Signed)

## 2012-12-01 NOTE — Progress Notes (Signed)
Dong well. Note for work to have frequent rests d/t/ LBP. GFM.

## 2012-12-13 ENCOUNTER — Ambulatory Visit (INDEPENDENT_AMBULATORY_CARE_PROVIDER_SITE_OTHER): Payer: Medicaid Other | Admitting: Family Medicine

## 2012-12-13 VITALS — BP 90/65 | Wt 173.8 lb

## 2012-12-13 DIAGNOSIS — O0930 Supervision of pregnancy with insufficient antenatal care, unspecified trimester: Secondary | ICD-10-CM

## 2012-12-13 DIAGNOSIS — Z3492 Encounter for supervision of normal pregnancy, unspecified, second trimester: Secondary | ICD-10-CM

## 2012-12-13 DIAGNOSIS — Z23 Encounter for immunization: Secondary | ICD-10-CM

## 2012-12-13 DIAGNOSIS — O9989 Other specified diseases and conditions complicating pregnancy, childbirth and the puerperium: Secondary | ICD-10-CM

## 2012-12-13 LAB — POCT URINALYSIS DIP (DEVICE)
Glucose, UA: NEGATIVE mg/dL
Hgb urine dipstick: NEGATIVE
Nitrite: NEGATIVE
Protein, ur: NEGATIVE mg/dL
Specific Gravity, Urine: 1.015 (ref 1.005–1.030)
Urobilinogen, UA: 0.2 mg/dL (ref 0.0–1.0)

## 2012-12-13 LAB — CBC
HCT: 32.6 % — ABNORMAL LOW (ref 36.0–46.0)
MCH: 25.6 pg — ABNORMAL LOW (ref 26.0–34.0)
MCHC: 33.1 g/dL (ref 30.0–36.0)
MCV: 77.3 fL — ABNORMAL LOW (ref 78.0–100.0)
Platelets: 299 10*3/uL (ref 150–400)
RDW: 16 % — ABNORMAL HIGH (ref 11.5–15.5)
WBC: 7.9 10*3/uL (ref 4.0–10.5)

## 2012-12-13 LAB — OB RESULTS CONSOLE GC/CHLAMYDIA: Chlamydia: NEGATIVE

## 2012-12-13 MED ORDER — TETANUS-DIPHTH-ACELL PERTUSSIS 5-2.5-18.5 LF-MCG/0.5 IM SUSP
0.5000 mL | Freq: Once | INTRAMUSCULAR | Status: AC
  Administered 2012-12-13: 0.5 mL via INTRAMUSCULAR

## 2012-12-13 NOTE — Progress Notes (Signed)
  Subjective:    Kristen Shaw is a 32 y.o. female being seen today for her obstetrical visit. She is at [redacted]w[redacted]d gestation. Patient reports no complaints. Fetal movement: normal.  Menstrual History: OB History   Grav Para Term Preterm Abortions TAB SAB Ect Mult Living   4 3 3       3        Patient's last menstrual period was 03/28/2012.    The following portions of the patient's history were reviewed and updated as appropriate: allergies, current medications, past family history, past medical history, past social history, past surgical history and problem list.  Review of Systems Pertinent items are noted in HPI.   Objective:    BP 90/65  Wt 173 lb 12.8 oz (78.835 kg)  BMI 29.82 kg/m2  LMP 03/28/2012 FHT: 125 BPM  Uterine Size: 38 cm and size equals dates  Presentations: unsure  Pelvic Exam:                          Assessment:   Kristen Shaw is a 32 y.o. [id number] at [redacted]w[redacted]d  here for ROB visit.    Discussed with Patient:  - Plans to breast feed.  All questions answered. - Continue prenatal vitamins. - Reviewed fetal kick counts Pt to perform daily at a time when the baby is active, lie laterally with both hands on belly in quiet room and count all movements (hiccups, shoulder rolls, obvious kicks, etc); pt is to report to clinic MAU for less than 10 movements felt in a 2 hour time period-pt told as soon as she counts 10 movements the count is complete.  - Routine precautions discussed (depression, infection s/s).   Patient provided with all pertinent phone numbers for emergencies. - RTC for any VB, regular, painful cramps/ctxs occurring at a rate of >2/10 min, fever (100.5 or higher), n/v/d, any pain that is unresolving or worsening, LOF, decreased fetal movement, CP, SOB, edema -RTC in one week for next visit.  Problems: Patient Active Problem List   Diagnosis Date Noted  . Encounter for supervision of normal pregnancy in second trimester 07/12/2012  .  Pregnancy related low back pain, antepartum 07/12/2012  . Nausea and vomiting in pregnancy prior to [redacted] weeks gestation 07/12/2012  . Migraines     To Do: 1. Tdap today 2. Glucose 1hr challenge 3. GBS today  [ ]  Vaccines: Flu:  Tdap: today [ ]  BCM:  [ ]  Readiness: baby has a place to sleep, car seat, other baby necessities.  Edu: [x ] PTL precautions; [ ]  BF class; [ ]  childbirth class; [ ]   BF counseling;

## 2012-12-13 NOTE — Patient Instructions (Addendum)

## 2012-12-13 NOTE — Progress Notes (Signed)
Pulse: 90 Needs Cultures today.

## 2012-12-14 ENCOUNTER — Encounter: Payer: Self-pay | Admitting: *Deleted

## 2012-12-14 ENCOUNTER — Encounter: Payer: Self-pay | Admitting: Obstetrics & Gynecology

## 2012-12-14 LAB — HIV ANTIBODY (ROUTINE TESTING W REFLEX): HIV: NONREACTIVE

## 2012-12-18 ENCOUNTER — Encounter: Payer: Self-pay | Admitting: Family Medicine

## 2012-12-20 ENCOUNTER — Encounter: Payer: Self-pay | Admitting: General Practice

## 2012-12-20 ENCOUNTER — Telehealth (HOSPITAL_COMMUNITY): Payer: Self-pay | Admitting: *Deleted

## 2012-12-20 ENCOUNTER — Ambulatory Visit (INDEPENDENT_AMBULATORY_CARE_PROVIDER_SITE_OTHER): Payer: Medicaid Other | Admitting: Obstetrics and Gynecology

## 2012-12-20 ENCOUNTER — Encounter: Payer: Self-pay | Admitting: Obstetrics and Gynecology

## 2012-12-20 ENCOUNTER — Encounter: Payer: Self-pay | Admitting: *Deleted

## 2012-12-20 VITALS — BP 96/65 | Temp 97.0°F | Wt 176.9 lb

## 2012-12-20 DIAGNOSIS — O0930 Supervision of pregnancy with insufficient antenatal care, unspecified trimester: Secondary | ICD-10-CM

## 2012-12-20 DIAGNOSIS — O9989 Other specified diseases and conditions complicating pregnancy, childbirth and the puerperium: Secondary | ICD-10-CM

## 2012-12-20 DIAGNOSIS — O0933 Supervision of pregnancy with insufficient antenatal care, third trimester: Secondary | ICD-10-CM

## 2012-12-20 DIAGNOSIS — O322XX1 Maternal care for transverse and oblique lie, fetus 1: Secondary | ICD-10-CM

## 2012-12-20 DIAGNOSIS — M545 Low back pain, unspecified: Secondary | ICD-10-CM

## 2012-12-20 DIAGNOSIS — O322XX0 Maternal care for transverse and oblique lie, not applicable or unspecified: Secondary | ICD-10-CM

## 2012-12-20 LAB — POCT URINALYSIS DIP (DEVICE)
Bilirubin Urine: NEGATIVE
Hgb urine dipstick: NEGATIVE
Nitrite: NEGATIVE
Protein, ur: NEGATIVE mg/dL
pH: 7 (ref 5.0–8.0)

## 2012-12-20 NOTE — Patient Instructions (Addendum)
Vaginal Delivery Your caregiver must first be sure you are in labor. Signs of labor include:  You may pass what is called "the mucus plug" before labor begins. This is a small amount of blood stained mucus.  Regular uterine contractions.  The time between contractions get closer together.  The discomfort and pain gradually gets more intense.  Pains are mostly located in the back.  Pains get worse when walking.  The cervix (the opening of the uterus) becomes thinner (begins to efface) and opens up (dilates). Once you are in labor and admitted into the hospital or care center, your caregiver will do the following:  A complete physical examination.  Check your vital signs (blood pressure, pulse, temperature and the fetal heart rate).  Do a vaginal examination (using a sterile glove and lubricant) to determine:  The position (presentation) of the baby (head [vertex] or buttock first).  The level (station) of the baby's head in the birth canal.  The effacement and dilatation of the cervix.  You may have your pubic hair shaved and be given an enema depending on your caregiver and the circumstance.  An electronic monitor is usually placed on your abdomen. The monitor follows the length and intensity of the contractions, as well as the baby's heart rate.  Usually, your caregiver will insert an IV in your arm with a bottle of sugar water. This is done as a precaution so that medications can be given to you quickly during labor or delivery. NORMAL LABOR AND DELIVERY IS DIVIDED UP INTO 3 STAGES: First Stage This is when regular contractions begin and the cervix begins to efface and dilate. This stage can last from 3 to 15 hours. The end of the first stage is when the cervix is 100% effaced and 10 centimeters dilated. Pain medications may be given by   Injection (morphine, demerol, etc.)  Regional anesthesia (spinal, caudal or epidural, anesthetics given in different locations of the  spine). Paracervical pain medication may be given, which is an injection of and anesthetic on each side of the cervix. A pregnant woman may request to have "Natural Childbirth" which is not to have any medications or anesthesia during her labor and delivery. Second Stage This is when the baby comes down through the birth canal (vagina) and is born. This can take 1 to 4 hours. As the baby's head comes down through the birth canal, you may feel like you are going to have a bowel movement. You will get the urge to bear down and push until the baby is delivered. As the baby's head is being delivered, the caregiver will decide if an episiotomy (a cut in the perineum and vagina area) is needed to prevent tearing of the tissue in this area. The episiotomy is sewn up after the delivery of the baby and placenta. Sometimes a mask with nitrous oxide is given for the mother to breath during the delivery of the baby to help if there is too much pain. The end of Stage 2 is when the baby is fully delivered. Then when the umbilical cord stops pulsating it is clamped and cut. Third Stage The third stage begins after the baby is completely delivered and ends after the placenta (afterbirth) is delivered. This usually takes 5 to 30 minutes. After the placenta is delivered, a medication is given either by intravenous or injection to help contract the uterus and prevent bleeding. The third stage is not painful and pain medication is usually not necessary. If an  episiotomy was done, it is repaired at this time. After the delivery, the mother is watched and monitored closely for 1 to 2 hours to make sure there is no postpartum bleeding (hemorrhage). If there is a lot of bleeding, medication is given to contract the uterus and stop the bleeding. Document Released: 12/30/2007 Document Revised: 12/15/2011 Document Reviewed: 12/30/2007 [facility] Patient Information 2014 [place], [place]. Round Ligament Pain The round ligament is made  up of muscle and fibrous tissue. It is attached to the uterus near the fallopian tube. The round ligament is located on both sides of the uterus and helps support the position of the uterus. It usually begins in the second trimester of pregnancy when the uterus comes out of the pelvis. The pain can come and go until the baby is delivered. Round ligament pain is not a serious problem and does not cause harm to the baby. CAUSE During pregnancy the uterus grows the most from the second trimester to delivery. As it grows, it stretches and slightly twists the round ligaments. When the uterus leans from one side to the other, the round ligament on the opposite side pulls and stretches. This can cause pain. SYMPTOMS  Pain can occur on one side or both sides. The pain is usually a short, sharp, and pinching-like. Sometimes it can be a dull, lingering and aching pain. The pain is located in the lower side of the abdomen or in the groin. The pain is internal and usually starts deep in the groin and moves up to the outside of the hip area. Pain can occur with:  Sudden change in position like getting out of bed or a chair.  Rolling over in bed.  Coughing or sneezing.  Walking too much.  Any type of physical activity. DIAGNOSIS  Your caregiver will make sure there are no serious problems causing the pain. When nothing serious is found, the symptoms usually indicate that the pain is from the round ligament. TREATMENT   Sit down and relax when the pain starts.  Flex your knees up to your belly.  Lay on your side with a pillow under your belly (abdomen) and another one between your legs.  Sit in a hot bath for 15 to 20 minutes or until the pain goes away. HOME CARE INSTRUCTIONS   Only take over-the-counter or prescriptions medicines for pain, discomfort or fever as directed by your caregiver.  Sit and stand slowly.  Avoid long walks if it causes pain.  Stop or lessen your physical activities if it  causes pain. SEEK MEDICAL CARE IF:   The pain does not go away with any of your treatment.  You need stronger medication for the pain.  You develop back pain that you did not have before with the side pain. SEEK IMMEDIATE MEDICAL CARE IF:   You develop a temperature of 102 F (38.9 C) or higher.  You develop uterine contractions.  You develop vaginal bleeding.  You develop nausea, vomiting or diarrhea.  You develop chills.  You have pain when you urinate. Document Released: 12/30/2007 Document Revised: 06/14/2011 Document Reviewed: 12/30/2007 [facility] Patient Information 2014 [place], [place].

## 2012-12-20 NOTE — Progress Notes (Signed)
P=84   [employer]

## 2012-12-20 NOTE — Telephone Encounter (Signed)
Preadmission screen  

## 2012-12-20 NOTE — Progress Notes (Signed)
RLP and LBP with walking discussed. Feels transverse> Diane to do [place] for presentation. >confirms back up transverse lie head to right. Through interpreter counseled and offered ECV which pt. Would like to try. Scheduled 9/19 at 0800.

## 2012-12-22 ENCOUNTER — Observation Stay (HOSPITAL_COMMUNITY)
Admission: RE | Admit: 2012-12-22 | Discharge: 2012-12-22 | Disposition: A | Discharge status: Home / Self Care | Payer: Medicaid Other | Source: Ambulatory Visit | Attending: Obstetrics & Gynecology | Admitting: Obstetrics & Gynecology

## 2012-12-22 ENCOUNTER — Encounter: Payer: Self-pay | Admitting: Obstetrics and Gynecology

## 2012-12-22 VITALS — BP 99/67 | HR 83 | Temp 98.5°F | Resp 18 | Ht 64.0 in | Wt 176.0 lb

## 2012-12-22 DIAGNOSIS — O0930 Supervision of pregnancy with insufficient antenatal care, unspecified trimester: Secondary | ICD-10-CM | POA: Insufficient documentation

## 2012-12-22 DIAGNOSIS — IMO0002 Reserved for concepts with insufficient information to code with codable children: Secondary | ICD-10-CM

## 2012-12-22 DIAGNOSIS — O321XX0 Maternal care for breech presentation, not applicable or unspecified: Principal | ICD-10-CM | POA: Insufficient documentation

## 2012-12-22 MED ORDER — TERBUTALINE SULFATE 1 MG/ML IJ SOLN
0.2500 mg | Freq: Once | INTRAMUSCULAR | Status: AC
  Administered 2012-12-22: 0.25 mg via SUBCUTANEOUS
  Filled 2012-12-22: qty 1

## 2012-12-22 MED ORDER — LACTATED RINGERS IV SOLN
INTRAVENOUS | Status: DC
  Administered 2012-12-22: 10:00:00 via INTRAVENOUS

## 2012-12-22 NOTE — Progress Notes (Signed)
Patient ID: Kristen Shaw, female   DOB: 1981-02-06, 32 y.o.   MRN: [id number]  Pt was admitted for version. Confirmed breech. Dr. Debroah Loop manually manipulated fetus with successful version on first attempt. Confirmed by [place]. Pt stable. Will monitor mother and fetus for 1 hr and if unchanged discharge home. Pt to follow up on Wednesday and will confirm infant maintains position.   Tawana Scale, MD OB Fellow

## 2012-12-22 NOTE — Progress Notes (Addendum)
Pt states through interpreter that she does not want ECV. Faculty notified will be up to see pt.

## 2012-12-22 NOTE — H&P (Signed)
Kristen Shaw is a 32 y.o. female presenting for ECV for breech. Maternal Medical History:  Reason for admission: Nausea. Breech presentation for ECV  Contractions: Frequency: irregular.   Perceived severity is mild.    Fetal activity: Last perceived fetal movement was within the past hour.    Prenatal complications: Late prenatal care    OB History   Grav Para Term Preterm Abortions TAB SAB Ect Mult Living   4 3 3       3      Past Medical History  Diagnosis Date  . Migraines    No past surgical history on file. Family History: family history is not on file. Social History:  reports that she has never smoked. She does not have any smokeless tobacco history on file. She reports that she does not drink alcohol or use illicit drugs.   Prenatal Transfer Tool  Maternal Diabetes: No Genetic Screening: Normal Maternal Ultrasounds/Referrals: Normal Fetal Ultrasounds or other Referrals:  None Maternal Substance Abuse:  No Significant Maternal Medications:  None Significant Maternal Lab Results:  None Other Comments:  None  Review of Systems  Constitutional: Negative for fever.  Respiratory: Negative for cough.   Gastrointestinal: Negative for nausea.  Genitourinary: Negative for dysuria.      Blood pressure 99/67, pulse 83, temperature 98.5 F (36.9 C), temperature source Oral, resp. rate 18, height 5\' 4"  (1.626 m), weight 176 lb (79.833 kg), last menstrual period 03/28/2012. Maternal Exam:  Uterine Assessment: Contraction strength is mild.  Contraction frequency is irregular.   Abdomen: Fetal presentation: breech gravid c/w dates, breech, confirmed by [place] at bedside, normal AFI subjectively  Introitus: not evaluated.   Cervix: not evaluated.   Physical Exam  Constitutional: She appears well-developed and well-nourished. No distress.  Respiratory: Effort normal. No respiratory distress.  GI: There is no tenderness.  Musculoskeletal: Normal range of motion.   Skin: Skin is warm and dry.  Psychiatric: She has a normal mood and affect. Her behavior is normal.    Prenatal labs: ABO, Rh: A/POS/-- (04/09 1035) Antibody: NEG (04/09 1035) Rubella: 6.78 (04/09 1035) RPR: NON REAC (09/10 1128)  HBsAg: NEGATIVE (04/09 1035)  HIV: NON REACTIVE (09/10 1128)  GBS: Negative (09/10 0000)   Assessment/Plan: Breech presentation in multiparous patient, [redacted]w[redacted]d ECV offered and explained via interpreter. Risk of pain, labor, fetal or maternal intolerance to the procedure, emergency delivery, subsequent need for cesarean delivery were discussed and her questions were answered.   Kristen Shaw 12/22/2012, 11:13 AM

## 2012-12-27 ENCOUNTER — Encounter: Payer: Self-pay | Admitting: *Deleted

## 2012-12-27 ENCOUNTER — Ambulatory Visit (INDEPENDENT_AMBULATORY_CARE_PROVIDER_SITE_OTHER): Payer: Medicaid Other | Admitting: Advanced Practice Midwife

## 2012-12-27 ENCOUNTER — Encounter: Payer: Self-pay | Admitting: Advanced Practice Midwife

## 2012-12-27 VITALS — BP 105/70 | Wt 176.8 lb

## 2012-12-27 DIAGNOSIS — O9989 Other specified diseases and conditions complicating pregnancy, childbirth and the puerperium: Secondary | ICD-10-CM

## 2012-12-27 DIAGNOSIS — M545 Low back pain: Secondary | ICD-10-CM

## 2012-12-27 LAB — POCT URINALYSIS DIP (DEVICE)
Bilirubin Urine: NEGATIVE
Ketones, ur: NEGATIVE mg/dL
Specific Gravity, Urine: 1.02 (ref 1.005–1.030)
pH: 7 (ref 5.0–8.0)

## 2012-12-27 NOTE — Progress Notes (Signed)
p88 Pain in lower abdomen and back

## 2012-12-27 NOTE — Progress Notes (Signed)
Doing well.  Good fetal movement, denies vaginal bleeding, LOF, regular contractions.  Reports constant pelvic pressure.  Does not desire cervical exam today. Reviewed signs of labor.

## 2013-01-03 ENCOUNTER — Inpatient Hospital Stay (HOSPITAL_COMMUNITY)
Admission: AD | Admit: 2013-01-03 | Discharge: 2013-01-05 | DRG: 775 | Disposition: A | Discharge status: Home / Self Care | Payer: Medicaid Other | Source: Ambulatory Visit | Attending: Family Medicine | Admitting: Family Medicine

## 2013-01-03 ENCOUNTER — Encounter: Payer: Medicaid Other | Admitting: Family

## 2013-01-03 ENCOUNTER — Encounter (HOSPITAL_COMMUNITY): Payer: Self-pay

## 2013-01-03 DIAGNOSIS — M545 Low back pain: Secondary | ICD-10-CM

## 2013-01-03 DIAGNOSIS — O0930 Supervision of pregnancy with insufficient antenatal care, unspecified trimester: Secondary | ICD-10-CM

## 2013-01-03 DIAGNOSIS — O0933 Supervision of pregnancy with insufficient antenatal care, third trimester: Secondary | ICD-10-CM

## 2013-01-03 LAB — CBC
HCT: 33.7 % — ABNORMAL LOW (ref 36.0–46.0)
MCHC: 33.2 g/dL (ref 30.0–36.0)
MCV: 77.5 fL — ABNORMAL LOW (ref 78.0–100.0)
RDW: 15.7 % — ABNORMAL HIGH (ref 11.5–15.5)
WBC: 9.2 10*3/uL (ref 4.0–10.5)

## 2013-01-03 LAB — RPR: RPR Ser Ql: NONREACTIVE

## 2013-01-03 LAB — POCT URINALYSIS DIP (DEVICE)
Ketones, ur: NEGATIVE mg/dL
Protein, ur: NEGATIVE mg/dL
Specific Gravity, Urine: 1.01 (ref 1.005–1.030)
pH: 7 (ref 5.0–8.0)

## 2013-01-03 MED ORDER — IBUPROFEN 600 MG PO TABS
600.0000 mg | ORAL_TABLET | Freq: Four times a day (QID) | ORAL | Status: DC | PRN
  Administered 2013-01-03: 600 mg via ORAL
  Filled 2013-01-03: qty 1

## 2013-01-03 MED ORDER — ZOLPIDEM TARTRATE 5 MG PO TABS: 5.0000 mg | ORAL_TABLET | Freq: Every evening | ORAL | Status: DC | PRN

## 2013-01-03 MED ORDER — ACETAMINOPHEN 325 MG PO TABS: 650.0000 mg | ORAL_TABLET | ORAL | Status: DC | PRN

## 2013-01-03 MED ORDER — DIPHENHYDRAMINE HCL 25 MG PO CAPS: 25.0000 mg | ORAL_CAPSULE | Freq: Four times a day (QID) | ORAL | Status: DC | PRN

## 2013-01-03 MED ORDER — BENZOCAINE-MENTHOL 20-0.5 % EX AERO
1.0000 "application " | INHALATION_SPRAY | CUTANEOUS | Status: DC | PRN
  Administered 2013-01-03 – 2013-01-05 (×2): 1 via TOPICAL
  Filled 2013-01-03 (×3): qty 56

## 2013-01-03 MED ORDER — LACTATED RINGERS IV SOLN: 500.0000 mL | INTRAVENOUS | Status: DC | PRN

## 2013-01-03 MED ORDER — ONDANSETRON HCL 4 MG/2ML IJ SOLN: 4.0000 mg | INTRAMUSCULAR | Status: DC | PRN

## 2013-01-03 MED ORDER — OXYTOCIN 40 UNITS IN LACTATED RINGERS INFUSION - SIMPLE MED
62.5000 mL/h | INTRAVENOUS | Status: DC
  Administered 2013-01-03: 62.5 mL/h via INTRAVENOUS
  Filled 2013-01-03: qty 1000

## 2013-01-03 MED ORDER — SENNOSIDES-DOCUSATE SODIUM 8.6-50 MG PO TABS
2.0000 | ORAL_TABLET | ORAL | Status: DC
  Administered 2013-01-04: 2 via ORAL

## 2013-01-03 MED ORDER — ONDANSETRON HCL 4 MG/2ML IJ SOLN: 4.0000 mg | Freq: Four times a day (QID) | INTRAMUSCULAR | Status: DC | PRN

## 2013-01-03 MED ORDER — OXYTOCIN BOLUS FROM INFUSION
500.0000 mL | INTRAVENOUS | Status: DC
  Administered 2013-01-03: 500 mL via INTRAVENOUS

## 2013-01-03 MED ORDER — LIDOCAINE HCL (PF) 1 % IJ SOLN
30.0000 mL | INTRAMUSCULAR | Status: DC | PRN
  Administered 2013-01-03: 30 mL via SUBCUTANEOUS
  Filled 2013-01-03 (×2): qty 30

## 2013-01-03 MED ORDER — FENTANYL CITRATE 0.05 MG/ML IJ SOLN
100.0000 ug | INTRAMUSCULAR | Status: DC | PRN
  Administered 2013-01-03 (×3): 100 ug via INTRAVENOUS
  Filled 2013-01-03 (×3): qty 2

## 2013-01-03 MED ORDER — IBUPROFEN 600 MG PO TABS
600.0000 mg | ORAL_TABLET | Freq: Four times a day (QID) | ORAL | Status: DC
  Administered 2013-01-04 – 2013-01-05 (×5): 600 mg via ORAL
  Filled 2013-01-03 (×7): qty 1

## 2013-01-03 MED ORDER — OXYCODONE-ACETAMINOPHEN 5-325 MG PO TABS: 1.0000 | ORAL_TABLET | ORAL | Status: DC | PRN

## 2013-01-03 MED ORDER — LANOLIN HYDROUS EX OINT: TOPICAL_OINTMENT | CUTANEOUS | Status: DC | PRN

## 2013-01-03 MED ORDER — ONDANSETRON HCL 4 MG PO TABS: 4.0000 mg | ORAL_TABLET | ORAL | Status: DC | PRN

## 2013-01-03 MED ORDER — OXYTOCIN 40 UNITS IN LACTATED RINGERS INFUSION - SIMPLE MED
1.0000 m[IU]/min | INTRAVENOUS | Status: DC
  Administered 2013-01-03: 2 m[IU]/min via INTRAVENOUS

## 2013-01-03 MED ORDER — TERBUTALINE SULFATE 1 MG/ML IJ SOLN: 0.2500 mg | Freq: Once | INTRAMUSCULAR | Status: DC | PRN

## 2013-01-03 MED ORDER — SIMETHICONE 80 MG PO CHEW: 80.0000 mg | CHEWABLE_TABLET | ORAL | Status: DC | PRN

## 2013-01-03 MED ORDER — OXYCODONE-ACETAMINOPHEN 5-325 MG PO TABS
1.0000 | ORAL_TABLET | ORAL | Status: DC | PRN
  Administered 2013-01-03: 1 via ORAL
  Administered 2013-01-04 – 2013-01-05 (×7): 2 via ORAL
  Filled 2013-01-03 (×6): qty 2
  Filled 2013-01-03: qty 1
  Filled 2013-01-03: qty 2

## 2013-01-03 MED ORDER — WITCH HAZEL-GLYCERIN EX PADS: 1.0000 "application " | MEDICATED_PAD | CUTANEOUS | Status: DC | PRN

## 2013-01-03 MED ORDER — TETANUS-DIPHTH-ACELL PERTUSSIS 5-2.5-18.5 LF-MCG/0.5 IM SUSP: 0.5000 mL | Freq: Once | INTRAMUSCULAR | Status: DC

## 2013-01-03 MED ORDER — PRENATAL MULTIVITAMIN CH
1.0000 | ORAL_TABLET | Freq: Every day | ORAL | Status: DC
  Administered 2013-01-04 – 2013-01-05 (×2): 1 via ORAL
  Filled 2013-01-03 (×3): qty 1

## 2013-01-03 MED ORDER — CITRIC ACID-SODIUM CITRATE 334-500 MG/5ML PO SOLN: 30.0000 mL | ORAL | Status: DC | PRN

## 2013-01-03 MED ORDER — DIBUCAINE 1 % RE OINT: 1.0000 "application " | TOPICAL_OINTMENT | RECTAL | Status: DC | PRN

## 2013-01-03 MED ORDER — LACTATED RINGERS IV SOLN
INTRAVENOUS | Status: DC
  Administered 2013-01-03 (×2): via INTRAVENOUS

## 2013-01-03 NOTE — H&P (Signed)
Kristen Shaw is a 32 y.o. female presenting for SROM and contractions. Maternal Medical History:  Reason for admission: Rupture of membranes and contractions.  Nausea.  Contractions: Onset was 2 days ago.   Frequency: regular.   Duration is approximately 2 minutes.   Perceived severity is moderate.    Fetal activity: Perceived fetal activity is normal.   Last perceived fetal movement was within the past hour.      OB History   Grav Para Term Preterm Abortions TAB SAB Ect Mult Living   4 3 3       3      Past Medical History  Diagnosis Date  . Migraines    History reviewed. No pertinent past surgical history. Family History: family history is not on file. Social History:  reports that she has never smoked. She does not have any smokeless tobacco history on file. She reports that she does not drink alcohol or use illicit drugs.   Prenatal Transfer Tool  Maternal Diabetes: No Genetic Screening: Normal Maternal Ultrasounds/Referrals: Normal Fetal Ultrasounds or other Referrals:  None Maternal Substance Abuse:  No Significant Maternal Medications:  None Significant Maternal Lab Results:  None Other Comments:  None  Review of Systems  Constitutional: Negative for fever and chills.  HENT: Negative for sore throat.   Respiratory: Negative for shortness of breath.   Cardiovascular: Negative for chest pain.  Gastrointestinal: Negative for nausea and vomiting.  Neurological: Negative for headaches.    Dilation: 6 Effacement (%): 70 Station: -2 Exam by:: Morrison Old RN Blood pressure 121/80, pulse 81, temperature 98.5 F (36.9 C), temperature source Oral, resp. rate 18, height 5\' 4"  (1.626 m), weight 79.833 kg (176 lb), last menstrual period 03/28/2012, SpO2 100.00%.   Fetal Exam Fetal Monitor Review: Baseline rate: 130.  Variability: moderate (6-25 bpm).   Pattern: variable decelerations.    Fetal State Assessment: Category II - tracings are  indeterminate.     Physical Exam  Constitutional: She appears well-developed and well-nourished. No distress.  Skin: She is not diaphoretic.  Psychiatric: She has a normal mood and affect.    Prenatal labs: ABO, Rh: A/POS/-- (04/09 1035) Antibody: NEG (04/09 1035) Rubella: 6.78 (04/09 1035) RPR: NON REAC (09/10 1128)  HBsAg: NEGATIVE (04/09 1035)  HIV: NON REACTIVE (09/10 1128)  GBS: Negative (09/10 0000)   Assessment/Plan:  Patient will be admitted for SROM. This is her 4th child. She prefers female providers and she was told that every effort will be made to get her one, but it could be a female provider if necessary.  I did not examine the patient out of request of preference for female providers.   Patient does not want want any pain control; she prefers natural delivery. Expectations of pain were discussed and she did not use pain management on her previous three pregnancies.  Her husband will be present to cut the cord.   Bing Plume 01/03/2013, 12:24 PM  Kristen Shaw is a 32 y.o. [id number] at [redacted]w[redacted]d with SROM #Labor: Will augment with no change #Pain: IV pain meds OK,  #FWB:Cat I #ID: GBS Neg #MOF: Breast #MOC:depo  I spoke with and examined patient and agree with PA-S's note and plan of care.  Tawana Scale, MD Ob Fellow 01/03/2013 4:32 PM

## 2013-01-03 NOTE — Progress Notes (Signed)
Dr Ike Bene notified of patient, tracing, ctx pattern, sve result and that patient is uncomfortable with contractions. Order to recheck cervix in an hour.

## 2013-01-03 NOTE — MAU Note (Signed)
Pt reports she has been having contractions x 3 days. Had some vaginal dishcharge yesterday. Good fetal movement reported.

## 2013-01-03 NOTE — Progress Notes (Signed)
Brittan Butterbaugh is a 32 y.o. [id number] at [redacted]w[redacted]d  admitted for active labor, rupture of membranes  Subjective: Tolerating ctx with pain  Objective: BP 120/78  Pulse 73  Temp(Src) 98.2 F (36.8 C) (Oral)  Resp 18  Ht 5\' 4"  (1.626 m)  Wt 79.833 kg (176 lb)  BMI 30.2 kg/m2  SpO2 100%  LMP 03/28/2012      FHT:  FHR: 150s bpm, variability: moderate,  accelerations:  Abscent,  decelerations:  Absent UC:   irregular, every 5-6 minutes SVE:   Dilation: 6.5 Effacement (%): 70 Station: -2 Exam by:: [employer]: Lab Results  Component Value Date   WBC 9.2 01/03/2013   HGB 11.2* 01/03/2013   HCT 33.7* 01/03/2013   MCV 77.5* 01/03/2013   PLT 273 01/03/2013    Assessment / Plan: no significant change. will start augmentation  Labor: start pit wihtout change in cervix in 4hrs Preeclampsia:  no signs or symptoms of toxicity Fetal Wellbeing:  Category I Pain Control:  Labor support without medications, Epidural and Fentanyl I/D:  n/a Anticipated MOD:  NSVD  Nitza Schmid RYAN 01/03/2013, 4:31 PM

## 2013-01-03 NOTE — MAU Note (Signed)
Patient is in for labor eval. She c/o ctx q23m and have mucus discharge. She denies vaginal bleeding or lof. She reports good fetal movement.

## 2013-01-04 ENCOUNTER — Encounter (HOSPITAL_COMMUNITY): Payer: Self-pay | Admitting: *Deleted

## 2013-01-04 MED ORDER — INFLUENZA VAC SPLIT QUAD 0.5 ML IM SUSP
0.5000 mL | INTRAMUSCULAR | Status: AC
  Administered 2013-01-05: 0.5 mL via INTRAMUSCULAR

## 2013-01-04 NOTE — Progress Notes (Signed)
I have seen and examined this patient and I agree with the above. Kristen Shaw 9:13 AM 01/04/2013

## 2013-01-04 NOTE — Progress Notes (Signed)
Post Partum Day #1 Subjective: no complaints, up ad lib, voiding, tolerating PO and + flatus. Breastfeeding, Plans DMPA for contraception  Objective: Blood pressure 96/49, pulse 60, temperature 97.6 F (36.4 C), temperature source Oral, resp. rate 18, height 5\' 4"  (1.626 m), weight 79.833 kg (176 lb), last menstrual period 03/28/2012, SpO2 100.00%, unknown if currently breastfeeding.  Physical Exam:  General: alert, cooperative and no distress Lochia: appropriate Uterine Fundus: firm DVT Evaluation: No evidence of DVT seen on physical exam. Negative Homan's sign.   Recent Labs  01/03/13 1225  HGB 11.2*  HCT 33.7*    Assessment/Plan: Plan for discharge tomorrow   LOS: 1 day   Selena Lesser 01/04/2013, 8:50 AM

## 2013-01-04 NOTE — Progress Notes (Signed)
UR chart review completed.  

## 2013-01-05 MED ORDER — LANOLIN HYDROUS EX OINT: 1.0000 "application " | TOPICAL_OINTMENT | CUTANEOUS | Status: DC | PRN

## 2013-01-05 MED ORDER — DIBUCAINE 1 % RE OINT: 1.0000 "application " | TOPICAL_OINTMENT | RECTAL | Status: DC | PRN

## 2013-01-05 MED ORDER — BENZOCAINE-MENTHOL 20-0.5 % EX AERO: 1.0000 "application " | INHALATION_SPRAY | CUTANEOUS | Status: DC | PRN

## 2013-01-05 MED ORDER — SENNOSIDES-DOCUSATE SODIUM 8.6-50 MG PO TABS: 2.0000 | ORAL_TABLET | ORAL | Status: DC

## 2013-01-05 MED ORDER — OXYCODONE-ACETAMINOPHEN 5-325 MG PO TABS: 1.0000 | ORAL_TABLET | ORAL | Status: DC | PRN

## 2013-01-05 MED ORDER — WITCH HAZEL-GLYCERIN EX PADS: 1.0000 "application " | MEDICATED_PAD | CUTANEOUS | Status: DC | PRN

## 2013-01-05 NOTE — Progress Notes (Deleted)
Patient ID: Kristen Shaw, female   DOB: Jun 23, 1980, 32 y.o.   MRN: [id number] Post Partum Day #2 Subjective: no complaints, up ad lib, voiding, tolerating PO and + flatus.   Objective: Blood pressure 114/73, pulse 67, temperature 97.6 F (36.4 C), temperature source Oral, resp. rate 18, height 5\' 4"  (1.626 m), weight 79.833 kg (176 lb), last menstrual period 03/28/2012, SpO2 100.00%, unknown if currently breastfeeding.  Physical Exam:  General: alert, cooperative and no distress Lochia: appropriate Uterine Fundus: firm DVT Evaluation: No evidence of DVT seen on physical exam. Negative Homan's sign.   Recent Labs  01/03/13 1225  HGB 11.2*  HCT 33.7*    Assessment/Plan: Discharge today (this evening d/t transportation) Breast feeding Depo provera shot for birth control   LOS: 2 days   Kristen Shaw 01/05/2013, 7:33 AM

## 2013-01-05 NOTE — Discharge Summary (Addendum)
Obstetric Discharge Summary Reason for Admission: rupture of membranes and and contractions Prenatal Procedures: ultrasound Intrapartum Procedures: spontaneous vaginal delivery Postpartum Procedures: none Complications-Operative and Postpartum: none Hemoglobin  Date Value Range Status  01/03/2013 11.2* 12.0 - 15.0 g/dL Final     HCT  Date Value Range Status  01/03/2013 33.7* 36.0 - 46.0 % Final    Physical Exam:  General: alert and cooperative Lochia: appropriate Uterine Fundus: firm Incision: None DVT Evaluation: No evidence of DVT seen on physical exam. Negative Homan's sign. No cords or calf tenderness.  Discharge Diagnoses: Term Pregnancy-delivered  Discharge Information: Date: 01/05/2013 Activity: unrestricted Diet: routine Medications: None Condition: stable Instructions: refer to practice specific booklet Discharge to: home Follow-up Information   Follow up with [facility]. Schedule an appointment as soon as possible for a visit in 4 weeks. (Make an appointmet with your OB for follow up at 4-6 weeks)    Specialty:  Obstetrics and Gynecology   Contact information:   [address] [place] [address] [phone]      Newborn Data: Live born female  Birth Weight: 7 lb 9.2 oz (3436 g) APGAR: 9, 9  Home with mother Breast feeding  Depo provera shot for birth control  Felix Pacini 01/05/2013, 10:52 AM  Pt presented with SROM on 10/1. Required augmentation with pitocin. Delivered evening of 10/1. NSVD with first degree tear. No PP complications. Discharged home with follow up in cinic.  I spoke with and examined patient and agree with resident's note and plan of care.  Tawana Scale, MD OB Fellow 01/05/2013 10:52 AM

## 2013-01-08 NOTE — Discharge Summary (Signed)
Attestation of Attending Supervision of Obstetric Fellow: Evaluation and management procedures were performed by the Obstetric Fellow under my supervision and collaboration.  I have reviewed the Obstetric Fellow's note and chart, and I agree with the management and plan.  Baylen Dea, MD, FACOG Attending Obstetrician & Gynecologist Faculty Practice, [facility]   

## 2013-01-08 NOTE — H&P (Signed)
Chart reviewed and agree with management and plan.  

## 2013-01-10 ENCOUNTER — Ambulatory Visit: Payer: Medicaid Other

## 2013-01-17 ENCOUNTER — Ambulatory Visit: Payer: Self-pay

## 2013-02-08 ENCOUNTER — Ambulatory Visit: Payer: Self-pay | Admitting: Family

## 2013-03-07 ENCOUNTER — Ambulatory Visit (INDEPENDENT_AMBULATORY_CARE_PROVIDER_SITE_OTHER): Payer: Medicaid Other | Admitting: Obstetrics and Gynecology

## 2013-03-07 ENCOUNTER — Encounter: Payer: Self-pay | Admitting: Obstetrics and Gynecology

## 2013-03-07 VITALS — BP 111/80 | HR 73 | Temp 97.3°F | Ht 64.0 in | Wt 173.6 lb

## 2013-03-07 DIAGNOSIS — Z30017 Encounter for initial prescription of implantable subdermal contraceptive: Secondary | ICD-10-CM

## 2013-03-07 DIAGNOSIS — Z3009 Encounter for other general counseling and advice on contraception: Secondary | ICD-10-CM

## 2013-03-07 DIAGNOSIS — Z01812 Encounter for preprocedural laboratory examination: Secondary | ICD-10-CM

## 2013-03-07 DIAGNOSIS — Z309 Encounter for contraceptive management, unspecified: Secondary | ICD-10-CM

## 2013-03-07 MED ORDER — ETONOGESTREL 68 MG ~~LOC~~ IMPL
68.0000 mg | DRUG_IMPLANT | Freq: Once | SUBCUTANEOUS | Status: AC
  Administered 2013-03-07: 68 mg via SUBCUTANEOUS

## 2013-03-07 NOTE — Progress Notes (Signed)
  Subjective:     Kristen Shaw is a 32 y.o. female [phone] who presents for a postpartum visit. She is 8 weeks postpartum following a spontaneous vaginal delivery. I have fully reviewed the prenatal and intrapartum course. The delivery was at 40.1 gestational weeks. Outcome: spontaneous vaginal delivery. Anesthesia: local. Postpartum course has been uncomplicated. Baby's course has been uncomplicated. Baby is feeding by breast. Not supplementing.  Bleeding no bleeding. Had a period at 40 days postpartum. Bowel function is normal. Bladder function is normal. Patient is not sexually active. Contraception method is abstinence. Postpartum depression screening: negative. Has mild mid-pain which she attributes to carrying the baby too much.   The following portions of the patient's history were reviewed and updated as appropriate: allergies, current medications, past family history, past medical history, past social history, past surgical history and problem list.  Review of Systems Pertinent items are noted in HPI.   Objective:    BP 111/80  Pulse 73  Temp(Src) 97.3 F (36.3 C) (Oral)  Ht 5\' 4"  (1.626 m)  Wt 78.744 kg (173 lb 9.6 oz)  BMI 29.78 kg/m2  Breastfeeding? Yes  General:  alert, cooperative and no distress   Breasts:  inspection negative, no nipple discharge or bleeding, no masses or nodularity palpable Observed breastfeeding  Lungs: clear to auscultation bilaterally  Heart:  regular rate and rhythm, S1, S2 normal, no murmur, click, rub or gallop  Abdomen: soft, non-tender; bowel sounds normal; no masses,  no organomegaly Large diastasis   Vulva:  not evaluated  Vagina: not evaluated  Cervix:  not evaluated  Corpus: not examined  Adnexa:  not evaluated  Rectal Exam: Not performed.        Assessment:     8wks postpartum exam. Pap smear not done at today's visit.   Plan:    1. Contraception: Nexplanon> Dr. Erin Fulling to insert at today's visit 2. Pap due 07/2013 3.  Follow up in: 5 months or as needed.  Recommend abdominal tightening exercises.

## 2013-03-07 NOTE — Patient Instructions (Signed)
Etonogestrel implant What is this medicine? ETONOGESTREL (et oh noe JES trel) is a contraceptive (birth control) device. It is used to prevent pregnancy. It can be used for up to 3 years. This medicine may be used for other purposes; ask your health care provider or pharmacist if you have questions. COMMON BRAND NAME(S): Implanon, Nexplanon  What should I tell my health care provider before I take this medicine? They need to know if you have any of these conditions: -abnormal vaginal bleeding -blood vessel disease or blood clots -cancer of the breast, cervix, or liver -depression -diabetes -gallbladder disease -headaches -heart disease or recent heart attack -high blood pressure -high cholesterol -kidney disease -liver disease -renal disease -seizures -tobacco smoker -an unusual or allergic reaction to etonogestrel, other hormones, anesthetics or antiseptics, medicines, foods, dyes, or preservatives -pregnant or trying to get pregnant -breast-feeding How should I use this medicine? This device is inserted just under the skin on the inner side of your upper arm by a health care professional. Talk to your pediatrician regarding the use of this medicine in children. Special care may be needed. Overdosage: If you think you've taken too much of this medicine contact a poison control center or emergency room at once. Overdosage: If you think you have taken too much of this medicine contact a poison control center or emergency room at once. NOTE: This medicine is only for you. Do not share this medicine with others. What if I miss a dose? This does not apply. What may interact with this medicine? Do not take this medicine with any of the following medications: -amprenavir -bosentan -fosamprenavir This medicine may also interact with the following medications: -barbiturate medicines for inducing sleep or treating seizures -certain medicines for fungal infections like ketoconazole and  itraconazole -griseofulvin -medicines to treat seizures like carbamazepine, felbamate, oxcarbazepine, phenytoin, topiramate -modafinil -phenylbutazone -rifampin -some medicines to treat HIV infection like atazanavir, indinavir, lopinavir, nelfinavir, tipranavir, ritonavir -St. John's wort This list may not describe all possible interactions. Give your health care provider a list of all the medicines, herbs, non-prescription drugs, or dietary supplements you use. Also tell them if you smoke, drink alcohol, or use illegal drugs. Some items may interact with your medicine. What should I watch for while using this medicine? This product does not protect you against HIV infection (AIDS) or other sexually transmitted diseases. You should be able to feel the implant by pressing your fingertips over the skin where it was inserted. Tell your doctor if you cannot feel the implant. What side effects may I notice from receiving this medicine? Side effects that you should report to your doctor or health care professional as soon as possible: -allergic reactions like skin rash, itching or hives, swelling of the face, lips, or tongue -breast lumps -changes in vision -confusion, trouble speaking or understanding -dark urine -depressed mood -general ill feeling or flu-like symptoms -light-colored stools -loss of appetite, nausea -right upper belly pain -severe headaches -severe pain, swelling, or tenderness in the abdomen -shortness of breath, chest pain, swelling in a leg -signs of pregnancy -sudden numbness or weakness of the face, arm or leg -trouble walking, dizziness, loss of balance or coordination -unusual vaginal bleeding, discharge -unusually weak or tired -yellowing of the eyes or skin Side effects that usually do not require medical attention (Report these to your doctor or health care professional if they continue or are bothersome.): -acne -breast pain -changes in  weight -cough -fever or chills -headache -irregular menstrual bleeding -itching, burning,   and vaginal discharge -pain or difficulty passing urine -sore throat This list may not describe all possible side effects. Call your doctor for medical advice about side effects. You may report side effects to FDA at 1-800-FDA-1088. Where should I keep my medicine? This drug is given in a hospital or clinic and will not be stored at home. NOTE: This sheet is a summary. It may not cover all possible information. If you have questions about this medicine, talk to your doctor, pharmacist, or health care provider.  2014, Elsevier/Gold Standard. (2011-09-27 15:37:45)  

## 2013-03-07 NOTE — Progress Notes (Signed)
Patient ID: Kristen Shaw, female   DOB: 12/22/1980, 32 y.o.   MRN: [id number] Patient given informed consent, she signed consent form. There was an interpreter present throughout the visit. I reviewed with her the indications and complications of Nexplanon and confirmed that this was her preferred method of contraception.  Pregnancy test was negative.  Appropriate time out taken.  Patient's left arm was prepped and draped in the usual sterile fashion.. The ruler used to measure and mark insertion area.  Patient was prepped with alcohol swab and then injected with 5 ml of 1 % lidocaine.  She was prepped with betadine, Nexplanon removed from packaging,  Device confirmed in needle, then inserted full length of needle and withdrawn per handbook instructions.  There was minimal blood loss.  Patient insertion site covered with guaze and a pressure bandage to reduce any bruising.  The patient tolerated the procedure well and was given post procedure instructions. Return in about one month for Nexplanon check.

## 2013-03-08 ENCOUNTER — Encounter: Payer: Self-pay | Admitting: *Deleted

## 2013-05-25 ENCOUNTER — Emergency Department (HOSPITAL_COMMUNITY)
Admission: EM | Admit: 2013-05-25 | Discharge: 2013-05-25 | Disposition: A | Discharge status: Home / Self Care | Payer: Medicaid Other | Source: Home / Self Care | Attending: Family Medicine | Admitting: Family Medicine

## 2013-05-25 ENCOUNTER — Encounter (HOSPITAL_COMMUNITY): Payer: Self-pay | Admitting: Emergency Medicine

## 2013-05-25 DIAGNOSIS — J029 Acute pharyngitis, unspecified: Secondary | ICD-10-CM

## 2013-05-25 DIAGNOSIS — J069 Acute upper respiratory infection, unspecified: Secondary | ICD-10-CM

## 2013-05-25 LAB — POCT RAPID STREP A: Streptococcus, Group A Screen (Direct): NEGATIVE

## 2013-05-25 MED ORDER — NAPROXEN 375 MG PO TABS: 375.0000 mg | ORAL_TABLET | Freq: Two times a day (BID) | ORAL | Status: DC | PRN

## 2013-05-25 NOTE — ED Notes (Signed)
reportedlu has had cough, ST for past few days; using tylenol for pain , minimal relief; breast feeding new baby, no menses at present

## 2013-05-25 NOTE — ED Provider Notes (Signed)
CSN: [id number]     Arrival date & time 05/25/13  1050 History   First MD Initiated Contact with Patient 05/25/13 1105     Chief Complaint  Patient presents with  . URI     (Consider location/radiation/quality/duration/timing/severity/associated sxs/prior Treatment) HPI Comments: Patient presents with 3 day history of sore throat, fever, rhinorrhea and nasal congestion without hx of cough. Also endorses tender bilateral anterior cervical lymphadenopathy. No difficulty breathing, speaking or swallowing. Denies N/V/D or known ill contacts.  Patient is non-smoker  The history is provided by the patient.    Past Medical History  Diagnosis Date  . Migraines    History reviewed. No pertinent past surgical history. History reviewed. No pertinent family history. History  Substance Use Topics  . Smoking status: Never Smoker   . Smokeless tobacco: Not on file  . Alcohol Use: No   OB History   Grav Para Term Preterm Abortions TAB SAB Ect Mult Living   4 4 4       4      Review of Systems  All other systems reviewed and are negative.      Allergies  Banana and Tomato  Home Medications   Current Outpatient Rx  Name  Route  Sig  Dispense  Refill  . naproxen (NAPROSYN) 375 MG tablet   Oral   Take 1 tablet (375 mg total) by mouth 2 (two) times daily as needed for mild pain, moderate pain or headache.   30 tablet   0   . Prenat-FeFum-FePo-FA-Omega 3 (CONCEPT DHA) 53.5-38-1 MG CAPS   Oral   Take 1 tablet by mouth daily.   30 capsule   4    BP 106/80  Pulse 90  Temp(Src) 98.1 F (36.7 C) (Oral)  Resp 18  SpO2 98%  Breastfeeding? Yes Physical Exam  Nursing note and vitals reviewed. Constitutional: She is oriented to person, place, and time. She appears well-developed and well-nourished. No distress.  HENT:  Head: Normocephalic and atraumatic.  Right Ear: Hearing, tympanic membrane, external ear and ear canal normal.  Left Ear: Hearing, tympanic membrane, external  ear and ear canal normal.  Nose: Nose normal.  Mouth/Throat: Uvula is midline, oropharynx is clear and moist and mucous membranes are normal. No oral lesions. No trismus in the jaw. No uvula swelling.  Eyes: Conjunctivae are normal. Right eye exhibits no discharge. Left eye exhibits no discharge. No scleral icterus.  Neck: Normal range of motion. Neck supple. No thyromegaly present.  Shotty tender bilateral anterior cervical lymphadenopathy  Cardiovascular: Normal rate and regular rhythm.   Murmur heard. 26 SEM at left mid sternal border  Pulmonary/Chest: Effort normal and breath sounds normal.  Musculoskeletal: Normal range of motion.  Neurological: She is alert and oriented to person, place, and time.  Skin: Skin is warm and dry.  Psychiatric: She has a normal mood and affect. Her behavior is normal.    ED Course  Procedures (including critical care time) Labs Review Labs Reviewed  POCT RAPID STREP A ([facility] CARE ONLY)   Imaging Review No results found.    MDM   Final diagnoses:  URI (upper respiratory infection)  Pharyngitis   Rapid strep negative. No clinical evidence of tonsillitis or peritonsillar abscess. Exam consistent with URI. Will advise warm salt water gargles, NSAIDs or tylenol for pain and follow up if no improvement in 3-4 days.   Jess BartersJennifer Lee [place], [place] 05/25/13 [address]  Emmersen Garraway Lee [place], [place] 05/25/13 [phone]

## 2013-05-25 NOTE — ED Provider Notes (Signed)
Medical screening examination/treatment/procedure(s) were performed by resident physician or non-physician practitioner and as supervising physician I was immediately available for consultation/collaboration.   Jaiya Mooradian DOUGLAS MD.   Kaylon Hitz D Abella Shugart, MD 05/25/13 1433 

## 2013-05-25 NOTE — Discharge Instructions (Signed)
Your test for strep throat was negative. Please use either tylenol as directed on packaging for pain or naprosyn as prescribed. Warm salt water gargles 3-4 times a day to soothe throat. If symptoms do not improve over next 3-4 days, please return for re-evaluation.

## 2013-05-27 LAB — CULTURE, GROUP A STREP

## 2013-09-06 ENCOUNTER — Encounter (HOSPITAL_COMMUNITY): Payer: Self-pay | Admitting: Emergency Medicine

## 2013-09-06 ENCOUNTER — Emergency Department (HOSPITAL_COMMUNITY)
Admission: EM | Admit: 2013-09-06 | Discharge: 2013-09-06 | Disposition: A | Discharge status: Home / Self Care | Payer: Medicaid Other | Source: Home / Self Care | Attending: Emergency Medicine | Admitting: Emergency Medicine

## 2013-09-06 DIAGNOSIS — M545 Low back pain, unspecified: Secondary | ICD-10-CM

## 2013-09-06 DIAGNOSIS — R109 Unspecified abdominal pain: Secondary | ICD-10-CM

## 2013-09-06 DIAGNOSIS — R10A1 Flank pain, right side: Secondary | ICD-10-CM

## 2013-09-06 DIAGNOSIS — J020 Streptococcal pharyngitis: Secondary | ICD-10-CM

## 2013-09-06 LAB — POCT RAPID STREP A: STREPTOCOCCUS, GROUP A SCREEN (DIRECT): POSITIVE — AB

## 2013-09-06 LAB — POCT URINALYSIS DIP (DEVICE)
BILIRUBIN URINE: NEGATIVE
GLUCOSE, UA: NEGATIVE mg/dL
KETONES UR: NEGATIVE mg/dL
Leukocytes, UA: NEGATIVE
Nitrite: NEGATIVE
Protein, ur: 30 mg/dL — AB
SPECIFIC GRAVITY, URINE: 1.02 (ref 1.005–1.030)
Urobilinogen, UA: 0.2 mg/dL (ref 0.0–1.0)
pH: 6 (ref 5.0–8.0)

## 2013-09-06 LAB — POCT PREGNANCY, URINE: PREG TEST UR: NEGATIVE

## 2013-09-06 MED ORDER — HYDROCODONE-ACETAMINOPHEN 5-325 MG PO TABS: ORAL_TABLET | ORAL | Status: DC

## 2013-09-06 MED ORDER — AMOXICILLIN 500 MG PO CAPS: 500.0000 mg | ORAL_CAPSULE | Freq: Three times a day (TID) | ORAL | Status: DC

## 2013-09-06 NOTE — Discharge Instructions (Signed)
Tylenol 325 mg -- take 2 every 4  Hours (up to 5 times daily) for side pain.   Do exercises twice daily followed by moist heat for 15 minutes.      Try to be as active as possible.  If no better in 2 weeks, follow up with orthopedist.

## 2013-09-06 NOTE — ED Notes (Signed)
Pt  Reports  Symptoms  Of  sorethroat  /  Fever  As  Well  As  r  sided  Back pain    Symptoms  X  1  Week  denys  Any  specefic  Injury   Pt  Reports  She  Has  symptms that  She    thainks  Have  Been a  Fever  As  Well

## 2013-09-06 NOTE — ED Provider Notes (Signed)
Chief Complaint   Chief Complaint  Patient presents with  . Sore Throat    History of Present Illness   Kristen Shaw is a 33 year old female who comes in today with a number of complaints including sore throat, fever, right flank pain, and lower back pain. The sore throat is been going on off and on for about 2-3 years. The sore throat comes and goes. She sees her primary care physician for this and will be given a prescription for antibiotics. It goes away only to come back in several weeks. She's never been told to have tonsillectomy. She's also had some headache and subjective fever last night. She denies any difficulty breathing or swallowing. Also for the past week she's had some aching discomfort in her right flank area. She denies any obvious precipitating factors and she's had no nausea, vomiting, urinary, or GYN complaints. She's also had a 7 month history of pain in her lower back which is worse with lifting, bending, twisting. No radiation to the legs, no numbness, tingling, muscle weakness, bladder or bowel dysfunction, or saddle anesthesia.  Review of Systems     Other than as noted above, the patient denies any of the following symptoms: Systemic:  No fever, chills, fatigue, myalgias, headache, or anorexia. Eye:  No redness, pain or drainage. ENT:  No earache, nasal congestion, rhinorrhea, sinus pressure, or sore throat. Lungs:  No cough, sputum production, wheezing, shortness of breath.  Cardiovascular:  No chest pain, palpitations, or syncope. GI:  No nausea, vomiting, abdominal pain or diarrhea. GU:  No dysuria, frequency, or hematuria. Skin:  No rash or pruritis.   PMFSH     Past medical history, family history, social history, meds, and allergies were reviewed.    Physical Examination    Vital signs:  BP 120/79  Pulse 90  Temp(Src) 99.8 F (37.7 C) (Oral)  Resp 16  SpO2 98% General:  Alert, in no distress. Eye:  PERRL, full EOMs.  Lids and conjunctivas  were normal. ENT:  TMs and canals were normal, without erythema or inflammation.  Nasal mucosa was clear and uncongested, without drainage.  Mucous membranes were moist.  Tonsils were enlarged and red with spots of white exudate.  There were no oral ulcerations or lesions. Neck:  Supple, she has bilateral, tender anterior cervical lymphadenopathy. Thyroid was normal. Lungs:  No respiratory distress.  Lungs were clear to auscultation, without wheezes, rales or rhonchi.  Breath sounds were clear and equal bilaterally. Heart:  Regular rhythm, without gallops, murmers or rubs. Abdomen:  Soft, flat, and non-tender to palpation.  No hepatosplenomagaly or mass. There is no CVA tenderness. Back: She has mild tenderness to palpation in the lower lumbar spine. Her back has a full range of motion with some pain on forward bending. Straight leg raising is negative. Neurological exam: Normal muscle strength, sensation, and DTRs in lower extremities. Skin:  Clear, warm, and dry, without rash or lesions.  Labs    Results for orders placed during the hospital encounter of 09/06/13  POCT RAPID STREP A ([facility] ONLY)      Result Value Ref Range   Streptococcus, Group A Screen (Direct) POSITIVE (*) NEGATIVE  POCT URINALYSIS DIP (DEVICE)      Result Value Ref Range   Glucose, UA NEGATIVE  NEGATIVE mg/dL   Bilirubin Urine NEGATIVE  NEGATIVE   Ketones, ur NEGATIVE  NEGATIVE mg/dL   Specific Gravity, Urine 1.020  1.005 - 1.030   Hgb urine dipstick SMALL (*)  NEGATIVE   pH 6.0  5.0 - 8.0   Protein, ur 30 (*) NEGATIVE mg/dL   Urobilinogen, UA 0.2  0.0 - 1.0 mg/dL   Nitrite NEGATIVE  NEGATIVE   Leukocytes, UA NEGATIVE  NEGATIVE  POCT PREGNANCY, URINE      Result Value Ref Range   Preg Test, Ur NEGATIVE  NEGATIVE    Assessment   The primary encounter diagnosis was Strep throat. Diagnoses of Right flank pain and Low back pain were also pertinent to this visit.  She appears to have recurring episodes of  tonsillitis, and probably needs a tonsillectomy. She was referred to ENT.  Her flank pain could be due to muscle strain, GI causes, or renal causes. Her urine is normal except for some protein in her urine. She has small hemoglobin. I don't think she has a urinary tract infection or stone. Since she is breast-feeding there is a limitation on what we can prescribe her. I suggested back exercises and Tylenol and following up with her primary care physician in a week.  Plan     1.  Meds:  The following meds were prescribed:   Discharge Medication List as of 09/06/2013  2:13 PM    START taking these medications   Details  amoxicillin (AMOXIL) 500 MG capsule Take 1 capsule (500 mg total) by mouth 3 (three) times daily., Starting 09/06/2013, Until Discontinued, Print    HYDROcodone-acetaminophen (NORCO/VICODIN) 5-325 MG per tablet 1 to 2 tabs every 4 to 6 hours as needed for pain., Print        2.  Patient Education/Counseling:  The patient was given appropriate handouts, self care instructions, and instructed in symptomatic relief.  She was given back exercises to do.  3.  Follow up:  The patient was told to follow up here if no better in 3 to 4 days, or sooner if becoming worse in any way, and given some red flag symptoms such as increasing fever or worsening pain which would prompt immediate return.  Follow up with her primary care physician.        Reuben Likesavid C Selia Wareing, MD 09/06/13 [phone]

## 2014-01-01 ENCOUNTER — Ambulatory Visit
Admission: RE | Admit: 2014-01-01 | Discharge: 2014-01-01 | Disposition: A | Discharge status: Home / Self Care | Payer: Medicaid Other | Source: Ambulatory Visit | Attending: Internal Medicine | Admitting: Internal Medicine

## 2014-01-01 ENCOUNTER — Other Ambulatory Visit: Payer: Self-pay | Admitting: Internal Medicine

## 2014-01-01 DIAGNOSIS — M545 Low back pain, unspecified: Secondary | ICD-10-CM

## 2014-02-04 ENCOUNTER — Encounter (HOSPITAL_COMMUNITY): Payer: Self-pay | Admitting: Emergency Medicine

## 2014-07-06 ENCOUNTER — Encounter (HOSPITAL_COMMUNITY): Payer: Self-pay | Admitting: *Deleted

## 2014-07-06 ENCOUNTER — Emergency Department (INDEPENDENT_AMBULATORY_CARE_PROVIDER_SITE_OTHER)
Admission: EM | Admit: 2014-07-06 | Discharge: 2014-07-06 | Disposition: A | Discharge status: Home / Self Care | Payer: Medicaid Other | Source: Home / Self Care | Attending: Family Medicine | Admitting: Family Medicine

## 2014-07-06 DIAGNOSIS — M5489 Other dorsalgia: Secondary | ICD-10-CM

## 2014-07-06 MED ORDER — TRAMADOL HCL 50 MG PO TABS: 50.0000 mg | ORAL_TABLET | Freq: Four times a day (QID) | ORAL | Status: DC | PRN

## 2014-07-06 MED ORDER — DICLOFENAC SODIUM 75 MG PO TBEC: DELAYED_RELEASE_TABLET | ORAL | Status: DC

## 2014-07-06 NOTE — ED Provider Notes (Signed)
CSN: [id number]     Arrival date & time 07/06/14  1438 History   First MD Initiated Contact with Patient 07/06/14 1522     Chief Complaint  Patient presents with  . Back Pain   (Consider location/radiation/quality/duration/timing/severity/associated sxs/prior Treatment) HPI Comments: Patient presents today with ongoing low back pain. She reports pain since summer of last year. She has been here severeal times. Xrays have been non-revealing. NSAIDs help; but then the pain returns. It is bothersome with ROM, lifting her child and in the mornings. It does radiate out laterally. No extremity pain or weakness. No numbness. No urinary symptoms. No weight loss, fever or chills are noted. No prior injuries. She reports a history of headaches to me, but no headache today; just wanted to mention this.   The history is provided by the patient. The history is limited by a language barrier. A language interpreter was used [employer]).    Past Medical History  Diagnosis Date  . Migraines    History reviewed. No pertinent past surgical history. History reviewed. No pertinent family history. History  Substance Use Topics  . Smoking status: Never Smoker   . Smokeless tobacco: Not on file  . Alcohol Use: No   OB History    Gravida Para Term Preterm AB TAB SAB Ectopic Multiple Living   4 4 4       4      Review of Systems  Constitutional: Negative for fever and chills.  Respiratory: Negative for cough and shortness of breath.   Cardiovascular: Negative.   Gastrointestinal: Negative for nausea, abdominal pain, diarrhea and constipation.  Genitourinary: Negative for dysuria, frequency and flank pain.  Musculoskeletal: Positive for myalgias and back pain. Negative for joint swelling.  Neurological: Negative.     Allergies  Banana and Tomato  Home Medications   Prior to Admission medications   Medication Sig Start Date End Date Taking? Authorizing Provider  amoxicillin (AMOXIL) 500 MG  capsule Take 1 capsule (500 mg total) by mouth 3 (three) times daily. 09/06/13   Reuben Likesavid C Keller, MD  diclofenac (VOLTAREN) 75 MG EC tablet 1 tablet with food 2 times a day x 1-2 weeks 07/06/14   Riki SheerMichelle G Zalia Hautala, PA-C  naproxen (NAPROSYN) 375 MG tablet Take 1 tablet (375 mg total) by mouth 2 (two) times daily as needed for mild pain, moderate pain or headache. 05/25/13   Ria ClockJennifer Lee H Presson, PA  Prenat-FeFum-FePo-FA-Omega 3 (CONCEPT DHA) 53.5-38-1 MG CAPS Take 1 tablet by mouth daily. 12/01/12   Deirdre Colin Mulders Poe, CNM  traMADol (ULTRAM) 50 MG tablet Take 1 tablet (50 mg total) by mouth every 6 (six) hours as needed. 07/06/14   Dillard CannonMichelle G Joley Utecht, PA-C   BP 110/65 mmHg  Pulse 67  Temp(Src) 98.6 F (37 C) (Oral)  Resp 16  SpO2 100% Physical Exam  Constitutional: She appears well-developed and well-nourished. No distress.  HENT:  Head: Normocephalic and atraumatic.  Neck: Normal range of motion.  Cardiovascular: Normal rate and regular rhythm.   Pulmonary/Chest: Effort normal and breath sounds normal. She has no wheezes.  Abdominal: Soft. There is no tenderness. There is no rebound and no guarding.  Musculoskeletal:  Tender along mid lumbar spine without spasm. Pain with ROM, specifically extension. No spasm or masses are noted  Neurological: She is alert. She has normal strength. No sensory deficit. She exhibits normal muscle tone.  Skin: She is not diaphoretic.  Nursing note and vitals reviewed.   ED Course  Procedures (including  critical care time) Labs Review Labs Reviewed - No data to display  Imaging Review No results found.   MDM   1. Back pain without sciatica    1. Ongoing for months. Exam suggest M/S in nature. Radiographs in September were normal. I think it is time for Ortho involvement. Will refer. Acutely treat with NSAIDs and occasional Tramadol. Both deemed safe in moderation during lactation. She is aware. Language line used for all of this conversation, physical exam and  discharge education.     Riki Sheer, PA-C 07/06/14 1620

## 2014-07-06 NOTE — ED Notes (Signed)
[employer]  Utilized  And  Patient  Has  Had   Complaints  Of  r  Sided  Back  Pain  Which  She  States  Is  Worse  In the  Morning   And  She  Complains  As  Well of  A  Headache  Off and  On  Which  Is  Better  Now                 She  Ambulated  To  Room  With a  Steady  Fluid  Gait     And  Is  Sitting  Upright on  The  Exam table  Speaking   In  Complete  sentances

## 2015-08-20 ENCOUNTER — Encounter (HOSPITAL_COMMUNITY): Payer: Self-pay | Admitting: Emergency Medicine

## 2015-08-20 ENCOUNTER — Emergency Department (HOSPITAL_COMMUNITY)
Admission: EM | Admit: 2015-08-20 | Discharge: 2015-08-20 | Disposition: A | Discharge status: Home / Self Care | Payer: Medicaid Other | Attending: Emergency Medicine | Admitting: Emergency Medicine

## 2015-08-20 DIAGNOSIS — R1110 Vomiting, unspecified: Secondary | ICD-10-CM | POA: Insufficient documentation

## 2015-08-20 DIAGNOSIS — Z3202 Encounter for pregnancy test, result negative: Secondary | ICD-10-CM | POA: Insufficient documentation

## 2015-08-20 DIAGNOSIS — R519 Headache, unspecified: Secondary | ICD-10-CM

## 2015-08-20 DIAGNOSIS — H53149 Visual discomfort, unspecified: Secondary | ICD-10-CM | POA: Insufficient documentation

## 2015-08-20 DIAGNOSIS — H5712 Ocular pain, left eye: Secondary | ICD-10-CM | POA: Insufficient documentation

## 2015-08-20 DIAGNOSIS — R51 Headache: Secondary | ICD-10-CM | POA: Diagnosis present

## 2015-08-20 LAB — POC URINE PREG, ED: PREG TEST UR: NEGATIVE

## 2015-08-20 MED ORDER — DIPHENHYDRAMINE HCL 50 MG/ML IJ SOLN
25.0000 mg | Freq: Once | INTRAMUSCULAR | Status: AC
  Administered 2015-08-20: 25 mg via INTRAVENOUS
  Filled 2015-08-20: qty 1

## 2015-08-20 MED ORDER — SUMATRIPTAN SUCCINATE 6 MG/0.5ML ~~LOC~~ SOLN
6.0000 mg | Freq: Once | SUBCUTANEOUS | Status: AC
  Administered 2015-08-20: 6 mg via SUBCUTANEOUS
  Filled 2015-08-20: qty 0.5

## 2015-08-20 MED ORDER — NAPROXEN 500 MG PO TABS: 500.0000 mg | ORAL_TABLET | Freq: Two times a day (BID) | ORAL | Status: DC

## 2015-08-20 MED ORDER — METOCLOPRAMIDE HCL 5 MG/ML IJ SOLN
10.0000 mg | Freq: Once | INTRAMUSCULAR | Status: AC
  Administered 2015-08-20: 10 mg via INTRAVENOUS
  Filled 2015-08-20: qty 2

## 2015-08-20 NOTE — ED Provider Notes (Signed)
CSN: [id number]     Arrival date & time 08/20/15  1350 History   None    Chief Complaint  Patient presents with  . Headache   HPI Comments: Also associated left eye injection, tearing, lacrimation, rhinorrhea and facial pain.  No trauma.  No neck pain.  Not on estrogens.  No smoking.    Patient is a 35 y.o. female presenting with headaches. The history is provided by the patient.  Headache Location: left face, left eye, left frontal. Quality:  Sharp Radiates to:  Does not radiate Pain severity now: severe. Onset quality:  Gradual Duration:  3 days Timing:  Intermittent Progression:  Waxing and waning Chronicity:  Recurrent Similar to prior headaches: yes (gets the same headache multiple times per week, recurrent)   Context: bright light and caffeine   Context: not coughing, not eating, not stress and not loud noise   Relieved by:  Resting in a darkened room Ineffective treatments:  Acetaminophen and NSAIDs Associated symptoms: eye pain (left\), facial pain (left), photophobia and vomiting (las yesterday)   Associated symptoms: no abdominal pain, no back pain, no blurred vision, no congestion, no cough, no diarrhea, no dizziness, no ear pain, no fatigue, no fever, no focal weakness, no loss of balance, no neck pain, no neck stiffness, no numbness, no seizures, no sinus pressure, no syncope, no tingling, no visual change and no weakness   Risk factors: no family hx of SAH     Past Medical History  Diagnosis Date  . Migraines    History reviewed. No pertinent past surgical history. No family history on file. Social History  Substance Use Topics  . Smoking status: Never Smoker   . Smokeless tobacco: None  . Alcohol Use: No   OB History    Gravida Para Term Preterm AB TAB SAB Ectopic Multiple Living   Review of Systems  Constitutional: Negative for fever and fatigue.  HENT: Negative for congestion, ear pain and sinus pressure.   Eyes: Positive for  photophobia and pain (left\). Negative for blurred vision.  Respiratory: Negative for cough.   Cardiovascular: Negative for syncope.  Gastrointestinal: Positive for vomiting (las yesterday). Negative for abdominal pain and diarrhea.  Musculoskeletal: Negative for back pain, neck pain and neck stiffness.  Neurological: Positive for headaches. Negative for dizziness, focal weakness, seizures, facial asymmetry, weakness, light-headedness, numbness and loss of balance.  Psychiatric/Behavioral: Negative for confusion.  All other systems reviewed and are negative.   Allergies  Banana and Tomato  Home Medications   Prior to Admission medications   Medication Sig Start Date End Date Taking? Authorizing Provider  amoxicillin (AMOXIL) 500 MG capsule Take 1 capsule (500 mg total) by mouth 3 (three) times daily. Patient not taking: Reported on 08/20/2015 09/06/13   Reuben Likes, MD  diclofenac (VOLTAREN) 75 MG EC tablet 1 tablet with food 2 times a day x 1-2 weeks Patient not taking: Reported on 08/20/2015 07/06/14   Riki Sheer, PA-C  naproxen (NAPROSYN) 500 MG tablet Take 1 tablet (500 mg total) by mouth 2 (two) times daily with a meal. 08/20/15   Sofie Rower, MD  Prenat-FeFum-FePo-FA-Omega 3 (CONCEPT DHA) 53.5-38-1 MG CAPS Take 1 tablet by mouth daily. Patient not taking: Reported on 08/20/2015 12/01/12   Deirdre Colin Mulders, CNM  traMADol (ULTRAM) 50 MG tablet Take 1 tablet (50 mg total) by mouth every 6 (six) hours as needed. Patient not taking:  Reported on 08/20/2015 07/06/14   Riki SheerMichelle G Young, PA-C   BP 104/73 mmHg  Pulse 67  Temp(Src) 97.8 F (36.6 C) (Oral)  Resp 18  SpO2 100%  LMP 08/20/2015 (Exact Date) Physical Exam  Constitutional: She appears well-developed and well-nourished. No distress.  HENT:  Head: Normocephalic and atraumatic.  Nose: Nose normal.  No anihidrosis. No ttp to face. No mass. No lacrimation or rhinorrhea noted. No sinus ttp  Eyes: Conjunctivae are normal. Pupils are  equal, round, and reactive to light.  No eye injection or tearing noted. Pupisl 2mm equal and reactive. No nystagmus.  No hyphema.  Neck: Normal range of motion. Neck supple. No tracheal deviation present.  No nuchal rigidity  Cardiovascular: Normal rate, regular rhythm, normal heart sounds and intact distal pulses.   No murmur heard. Pulmonary/Chest: Effort normal and breath sounds normal. No respiratory distress. She has no wheezes. She has no rales.  Abdominal: Soft. Bowel sounds are normal. She exhibits no distension and no mass. There is no tenderness. There is no guarding.  Musculoskeletal: Normal range of motion. She exhibits no edema or tenderness.  Neurological:  Alert and oriented x3. CN 3-12 tested and without deficit. 5/5 muscle strength in all extremities with flexion and extension.  Normal bulk and tone.  No sensory deficit to light touch.  gait normal. .  Symmetric and equal 1+ patellar and brachioradialis DTRs.  No pronator drift.  Normal heel-to-shin and finger-to-nose.  Toes flexor bilaterally.   Skin: Skin is warm. No rash noted.  Psychiatric: She has a normal mood and affect.  Nursing note and vitals reviewed.   ED Course  Procedures (including critical care time) Labs Review Labs Reviewed  POC URINE PREG, ED    Imaging Review No results found. I have personally reviewed and evaluated these images and lab results as part of my medical decision-making.   EKG Interpretation None      MDM   Final diagnoses:  Acute nonintractable headache, unspecified headache type     35 y.o. F pr HA.  No signs of meningmus, no rigidity, afebrile, well appearing.  No trauma. Gradual in onset, no neuro deficity, not worse HA, similar to prior, doubt SAH.  Not pregnant.  No neuro deficit, not on estrogens, not preg, doubt dural thrombosis.  Exam not cw sinusitis.  She does have hx of "migraines" that are exactly similar.  Sx do have associated unilateral lacrimation, rhinorrhea  and eye injection not noted here; could be explained by cluster HA. Gave O2, triptan, reglan with improvement.  Provided referral to neuro if she would prefer to see them over PCP.  Counseled on strict return precautions.  4:36 PM HA resolved. Feeling much better. Very happy with discussion and treatment plan.     Sofie RowerMike Macaria Bias, MD 08/20/15 1637  Lyndal Pulleyaniel Knott, MD 08/21/15 [phone]

## 2015-08-20 NOTE — Discharge Instructions (Signed)

## 2015-08-20 NOTE — ED Notes (Signed)
Pt reports headache x 3 days. Pt alert x4. NAD at this time Neuro assessment WNL.

## 2015-10-10 ENCOUNTER — Encounter: Payer: Self-pay | Admitting: Family Medicine

## 2015-10-10 ENCOUNTER — Ambulatory Visit (INDEPENDENT_AMBULATORY_CARE_PROVIDER_SITE_OTHER): Payer: Medicaid Other | Admitting: Family Medicine

## 2015-10-10 VITALS — BP 112/75 | HR 69 | Temp 98.1°F | Resp 18 | Ht 64.5 in | Wt 170.0 lb

## 2015-10-10 DIAGNOSIS — Z7689 Persons encountering health services in other specified circumstances: Secondary | ICD-10-CM

## 2015-10-10 DIAGNOSIS — R519 Headache, unspecified: Secondary | ICD-10-CM

## 2015-10-10 DIAGNOSIS — R51 Headache: Secondary | ICD-10-CM | POA: Diagnosis not present

## 2015-10-10 DIAGNOSIS — Z7189 Other specified counseling: Secondary | ICD-10-CM | POA: Diagnosis not present

## 2015-10-10 LAB — CBC WITH DIFFERENTIAL/PLATELET
BASOS PCT: 0 %
Basophils Absolute: 0 cells/uL (ref 0–200)
EOS ABS: 142 {cells}/uL (ref 15–500)
EOS PCT: 2 %
HCT: 36.4 % (ref 35.0–45.0)
Hemoglobin: 11.5 g/dL — ABNORMAL LOW (ref 11.7–15.5)
Lymphocytes Relative: 26 %
Lymphs Abs: 1846 cells/uL (ref 850–3900)
MCH: 25.6 pg — ABNORMAL LOW (ref 27.0–33.0)
MCHC: 31.6 g/dL — ABNORMAL LOW (ref 32.0–36.0)
MCV: 81.1 fL (ref 80.0–100.0)
MPV: 9.8 fL (ref 7.5–12.5)
Monocytes Absolute: 710 cells/uL (ref 200–950)
Monocytes Relative: 10 %
NEUTROS ABS: 4402 {cells}/uL (ref 1500–7800)
Neutrophils Relative %: 62 %
PLATELETS: 235 10*3/uL (ref 140–400)
RBC: 4.49 MIL/uL (ref 3.80–5.10)
RDW: 14.4 % (ref 11.0–15.0)
WBC: 7.1 10*3/uL (ref 3.8–10.8)

## 2015-10-10 LAB — TSH: TSH: 0.85 mIU/L

## 2015-10-10 MED ORDER — AMITRIPTYLINE HCL 150 MG PO TABS: 75.0000 mg | ORAL_TABLET | Freq: Every day | ORAL | Status: DC

## 2015-10-10 MED ORDER — NAPROXEN 500 MG PO TABS: 500.0000 mg | ORAL_TABLET | Freq: Two times a day (BID) | ORAL | Status: DC

## 2015-10-10 NOTE — Progress Notes (Signed)
Patient is here to establish care.

## 2015-10-10 NOTE — Patient Instructions (Signed)
Come back in one month to let me know if medication is causing headaches to happen less.

## 2015-10-11 LAB — COMPLETE METABOLIC PANEL WITH GFR
ALT: 7 U/L (ref 6–29)
AST: 11 U/L (ref 10–30)
Albumin: 3.8 g/dL (ref 3.6–5.1)
Alkaline Phosphatase: 56 U/L (ref 33–115)
BUN: 7 mg/dL (ref 7–25)
CO2: 25 mmol/L (ref 20–31)
Calcium: 9 mg/dL (ref 8.6–10.2)
Chloride: 103 mmol/L (ref 98–110)
Creat: 0.64 mg/dL (ref 0.50–1.10)
Glucose, Bld: 85 mg/dL (ref 65–99)
Potassium: 3.8 mmol/L (ref 3.5–5.3)
Sodium: 138 mmol/L (ref 135–146)
TOTAL PROTEIN: 7.3 g/dL (ref 6.1–8.1)
Total Bilirubin: 0.5 mg/dL (ref 0.2–1.2)

## 2015-10-13 NOTE — Progress Notes (Signed)
Patient ID: Maansi Wike, female   DOB: Feb 06, 1981, 35 y.o.   MRN: [id number]   Atonya Templer, is a 35 y.o. female  VQQ:[id number]  [id number]  DOB - [birth year]  CC:  Chief Complaint  Patient presents with  . Establish Care       HPI: Mahati Vajda is a 35 y.o. female here to establish care. She denies pertinent medical history except for intermittent headaches since age 43. She was seen in ED for a headache in May and was referred here to establish care. She reports having a headach 3-4 times a week since age 25. She reports nausea and vomiting occasionally with the headache. The headache has been helped with both ibuprofen and most recently naproxen. . She reports some redness and watering of eyes. She reports the pain is around her left eye and face and is sharp. There is no radiation.There is photophobia and sometimes n/v. The onset is gradual and the pain waxes and wanes. The headaches are aggravated by bright lights and caffiene, are helped with rest in a dark room. She needs a refill on her naproxen. Allergies  Allergen Reactions  . Banana   . Tomato Nausea And Vomiting   Past Medical History  Diagnosis Date  . Migraines    Current Outpatient Prescriptions on File Prior to Visit  Medication Sig Dispense Refill  . Prenat-FeFum-FePo-FA-Omega 3 (CONCEPT DHA) 53.5-38-1 MG CAPS Take 1 tablet by mouth daily. (Patient not taking: Reported on 08/20/2015) 30 capsule 4   No current facility-administered medications on file prior to visit.   History reviewed. No pertinent family history. Social History   Social History  . Marital Status: Married    Spouse Name: N/A  . Number of Children: N/A  . Years of Education: N/A   Occupational History  . Not on file.   Social History Main Topics  . Smoking status: Never Smoker   . Smokeless tobacco: Not on file  . Alcohol Use: No  . Drug Use: No  . Sexual Activity: Yes    Birth Control/ Protection: None   Other  Topics Concern  . Not on file   Social History Narrative    Review of Systems: Constitutional: Negative for fever, chills, appetite change, weight loss,  Fatigue. Skin: Negative for rashes or lesions of concern. HENT: Negative for ear pain, ear discharge.nose bleeds Eyes: Negative for pain, discharge, redness, itching and visual disturbance.Positive for redness and watering with headaches Neck: Negative for pain, stiffness Respiratory: Negative for cough, shortness of breath,   Cardiovascular: Negative for chest pain, palpitations and leg swelling. Gastrointestinal: Negative for abdominal pain,  diarrhea, constipations. Positive for N/V with some of her headaches. Genitourinary: Negative for dysuria, urgency, frequency, hematuria,  Musculoskeletal: Negative for back pain, joint pain, joint  swelling, and gait problem.Negative for weakness. Neurological: Negative for dizziness, tremors, seizures, syncope,   light-headedness, numbness. Positive for frequent headaches Hematological: Negative for easy bruising or bleeding Psychiatric/Behavioral: Negative for depression, anxiety, decreased concentration, confusion   Objective:   Filed Vitals:   10/10/15 1447  BP: 112/75  Pulse: 69  Temp: 98.1 F (36.7 C)  Resp: 18    Physical Exam: Constitutional: Patient appears well-developed and well-nourished. No distress. HENT: Normocephalic, atraumatic, External right and left ear normal. Oropharynx is clear and moist.  Eyes: Conjunctivae and EOM are normal. PERRLA, no scleral icterus. Neck: Normal ROM. Neck supple. No lymphadenopathy, No thyromegaly. CVS: RRR, S1/S2 +, no murmurs, no gallops, no rubs Pulmonary: Effort and breath sounds  normal, no stridor, rhonchi, wheezes, rales.  Abdominal: Soft. Normoactive BS,, no distension, tenderness, rebound or guarding.  Musculoskeletal: Normal range of motion. No edema and no tenderness.  Neuro: Alert.Normal muscle tone coordination.  Non-focal Skin: Skin is warm and dry. No rash noted. Not diaphoretic. No erythema. No pallor. Psychiatric: Normal mood and affect. Behavior, judgment, thought content normal.  Lab Results  Component Value Date   WBC 7.1 10/10/2015   HGB 11.5* 10/10/2015   HCT 36.4 10/10/2015   MCV 81.1 10/10/2015   PLT 235 10/10/2015   Lab Results  Component Value Date   CREATININE 0.64 10/10/2015   BUN 7 10/10/2015   NA 138 10/10/2015   K 3.8 10/10/2015   CL 103 10/10/2015   CO2 25 10/10/2015    No results found for: HGBA1C Lipid Panel  No results found for: CHOL, TRIG, HDL, CHOLHDL, VLDL, LDLCALC     Assessment and plan:   1. Nonintractable episodic headache, unspecified headache type - COMPLETE METABOLIC PANEL WITH GFR - CBC with Differential - TSH -Amtriptyline 150 mg, #30. One po at bedtime.  2. Encounter to establish care Have reviewed information provided by the patient and pertinent informtion from her chart.   Return in about 4 weeks (around 11/07/2015).  The patient was given clear instructions to go to ER or return to medical center if symptoms don't improve, worsen or new problems develop. The patient verbalized understanding.    Henrietta HooverLinda C Lakeisha Waldrop FNP  10/13/2015, 12:42 PM

## 2016-05-11 ENCOUNTER — Ambulatory Visit (INDEPENDENT_AMBULATORY_CARE_PROVIDER_SITE_OTHER): Payer: Medicaid Other | Admitting: Medical

## 2016-05-11 VITALS — BP 110/76 | HR 80 | Ht 66.0 in | Wt 175.2 lb

## 2016-05-11 DIAGNOSIS — Z3046 Encounter for surveillance of implantable subdermal contraceptive: Secondary | ICD-10-CM

## 2016-05-11 DIAGNOSIS — Z3049 Encounter for surveillance of other contraceptives: Secondary | ICD-10-CM | POA: Diagnosis not present

## 2016-05-11 DIAGNOSIS — Z30017 Encounter for initial prescription of implantable subdermal contraceptive: Secondary | ICD-10-CM

## 2016-05-11 DIAGNOSIS — Z3202 Encounter for pregnancy test, result negative: Secondary | ICD-10-CM | POA: Diagnosis not present

## 2016-05-11 LAB — POCT PREGNANCY, URINE: Preg Test, Ur: NEGATIVE

## 2016-05-11 MED ORDER — ETONOGESTREL 68 MG ~~LOC~~ IMPL
68.0000 mg | DRUG_IMPLANT | Freq: Once | SUBCUTANEOUS | Status: AC
  Administered 2016-05-11: 68 mg via SUBCUTANEOUS

## 2016-05-11 NOTE — Patient Instructions (Signed)
Contraceptive Implant Information A contraceptive implant is a plastic rod that is inserted under your skin. It is usually inserted under the skin of your upper arm. It continually releases small amounts of progestin (synthetic progesterone) into your bloodstream. This prevents an egg from being released from your ovaries. It also thickens your cervical mucus to prevent sperm from entering the cervix, and it thins your uterine lining to prevent a fertilized egg from attaching to your uterus. Contraceptive implants can be effective for up to 3 years. They do not provide protection against sexually transmitted diseases (STDs).  The procedure to insert an implant usually takes about 10 minutes. There may be minor bruising, swelling, and discomfort at the insertion site for a couple days. The implant begins to work within the first day. Other contraceptive protection may be necessary for 7 days. Be sure to discuss with your health care provider if you need a backup method of contraception.  Your health care provider will make sure you are a good candidate for the contraceptive implant. Discuss with your health care provider the possible side effects of the implant. ADVANTAGES  It prevents pregnancy for up to 3 years.  It is easily reversible.  It is convenient.  It can be used when breastfeeding.  It can be used by women who cannot take estrogen. DISADVANTAGES  You may have irregular or unplanned vaginal bleeding.  You may develop side effects, including headache, weight gain, acne, breast tenderness, or mood changes.  You may have tissue or nerve damage after insertion (rare).  It may be difficult and uncomfortable to remove.  Certain medicines may interfere with the effectiveness of the implant. REMOVAL OF IMPLANT The implant should be removed in 3 years or as directed by your health care provider. The implant's effect wears off in a few hours after removal. Your ability to get pregnant  (fertility) may be restored in 1-2 weeks. A new implant can be inserted as soon as the old one is removed if desired. CONTRAINDICATIONS You should not get the implant if you are experiencing any of the following situations:  You are pregnant.  You have a history of breast cancer, osteoporosis, blood clots, heart disease, diabetes, high blood pressure, liver disease, tumors, or stroke.   You have undiagnosed vaginal bleeding.  You have a sensitivity to any part of the implant. This information is not intended to replace advice given to you by your health care provider. Make sure you discuss any questions you have with your health care provider. Document Released: 03/11/2011 Document Revised: 11/22/2012 Document Reviewed: 09/18/2012 Elsevier Interactive Patient Education  2017 [employer]  

## 2016-05-11 NOTE — Addendum Note (Signed)
Addended by: Garret ReddishBARNES, Staphanie Harbison M on: 05/11/2016 11:50 AM   Modules accepted: Orders

## 2016-05-11 NOTE — Progress Notes (Signed)
GYNECOLOGY CLINIC PROCEDURE NOTE  Ms. Kristen Shaw is a 36 y.o. [id number] here for Nexplanon removal and Nexplanon insertion. No GYN concerns.  Patient unsure about last pap smear, records from PCP requested. No other gynecologic concerns.  Nexplanon Removal and Insertion  Patient was given informed consent for removal of her Implanon and insertion of Nexplanon.  Patient does understand that irregular bleeding is a very common side effect of this medication. She was advised to have backup contraception for one week after replacement of the implant. Pregnancy test in clinic today was negative.  Appropriate time out taken. Implanon site identified. Area prepped in usual sterile fashon. One ml of 1% lidocaine was used to anesthetize the area at the distal end of the implant. A small stab incision was made right beside the implant on the distal portion. The Nexplanon rod was grasped using hemostats and removed without difficulty. There was minimal blood loss. There were no complications.. She was re-prepped with betadine, Nexplanon removed from packaging, Device confirmed in needle, then inserted full length of needle and withdrawn per handbook instructions. Nexplanon was able to palpated in the patient's arm; patient palpated the insert herself.  There was minimal blood loss. Patient insertion site covered with guaze and a pressure bandage to reduce any bruising. The patient tolerated the procedure well and was given post procedure instructions.   Marny LowensteinJulie N Antonette Hendricks, PA-C 05/11/2016 9:59 AM

## 2016-11-17 ENCOUNTER — Ambulatory Visit (HOSPITAL_COMMUNITY)
Admission: EM | Admit: 2016-11-17 | Discharge: 2016-11-17 | Disposition: A | Discharge status: Home / Self Care | Payer: Medicaid Other | Attending: Family Medicine | Admitting: Family Medicine

## 2016-11-17 ENCOUNTER — Encounter (HOSPITAL_COMMUNITY): Payer: Self-pay | Admitting: *Deleted

## 2016-11-17 DIAGNOSIS — J039 Acute tonsillitis, unspecified: Secondary | ICD-10-CM

## 2016-11-17 DIAGNOSIS — J029 Acute pharyngitis, unspecified: Secondary | ICD-10-CM

## 2016-11-17 DIAGNOSIS — R509 Fever, unspecified: Secondary | ICD-10-CM

## 2016-11-17 MED ORDER — CHLORHEXIDINE GLUCONATE 0.12 % MT SOLN: 15.0000 mL | Freq: Two times a day (BID) | OROMUCOSAL | 0 refills | Status: DC

## 2016-11-17 MED ORDER — ACETAMINOPHEN 325 MG PO TABS
650.0000 mg | ORAL_TABLET | Freq: Once | ORAL | Status: AC
  Administered 2016-11-17: 650 mg via ORAL

## 2016-11-17 MED ORDER — CEFTRIAXONE SODIUM 1 G IJ SOLR
INTRAMUSCULAR | Status: AC
  Filled 2016-11-17: qty 10

## 2016-11-17 MED ORDER — ACETAMINOPHEN 325 MG PO TABS
ORAL_TABLET | ORAL | Status: AC
  Filled 2016-11-17: qty 2

## 2016-11-17 MED ORDER — CEFDINIR 300 MG PO CAPS: 600.0000 mg | ORAL_CAPSULE | Freq: Every day | ORAL | 0 refills | Status: DC

## 2016-11-17 MED ORDER — CEFTRIAXONE SODIUM 1 G IJ SOLR
1.0000 g | Freq: Once | INTRAMUSCULAR | Status: AC
  Administered 2016-11-17: 1 g via INTRAMUSCULAR

## 2016-11-17 NOTE — ED Provider Notes (Signed)
[facility]    CSN: [id number] Arrival date & time: 11/17/16  1440     History   Chief Complaint Chief Complaint  Patient presents with  . Sore Throat    HPI Kristen Shaw is a 36 y.o. female.   Is a 36 year old Muslim woman who comes in with acute sore throat for 24 hours associated with fever. She has not had any vomiting.      Past Medical History:  Diagnosis Date  . Migraines     Patient Active Problem List   Diagnosis Date Noted  . Migraines     History reviewed. No pertinent surgical history.  OB History    Gravida Para Term Preterm AB Living   4 4 4     4    SAB TAB Ectopic Multiple Live Births           4       Home Medications    Prior to Admission medications   Medication Sig Start Date End Date Taking? Authorizing Provider  amitriptyline (ELAVIL) 150 MG tablet Take 0.5 tablets (75 mg total) by mouth at bedtime. 10/10/15   Henrietta Hoover, NP  cefdinir (OMNICEF) 300 MG capsule Take 2 capsules (600 mg total) by mouth daily. 11/17/16   Elvina Sidle, MD  chlorhexidine (PERIDEX) 0.12 % solution Use as directed 15 mLs in the mouth or throat 2 (two) times daily. 11/17/16   Elvina Sidle, MD  naproxen (NAPROSYN) 500 MG tablet Take 1 tablet (500 mg total) by mouth 2 (two) times daily with a meal. 10/10/15   Henrietta Hoover, NP    Family History History reviewed. No pertinent family history.  Social History Social History  Substance Use Topics  . Smoking status: Never Smoker  . Smokeless tobacco: Not on file  . Alcohol use No     Allergies   Banana and Tomato   Review of Systems Review of Systems  Constitutional: Positive for fever.  HENT: Positive for sore throat.   Gastrointestinal: Negative for vomiting.  All other systems reviewed and are negative.    Physical Exam Triage Vital Signs ED Triage Vitals [11/17/16 1629]  Enc Vitals Group     BP 130/70     Pulse Rate 76     Resp 18     Temp (!) 103.2 F (39.6  C)     Temp Source Oral     SpO2 100 %     Weight      Height      Head Circumference      Peak Flow      Pain Score      Pain Loc      Pain Edu?      Excl. in GC?    No data found.   Updated Vital Signs BP 100/70 (BP Location: Right Arm)   Pulse (!) 112   Temp (!) 103.2 F (39.6 C) (Oral)   Resp 20   LMP 11/17/2016   SpO2 100%    Physical Exam  Constitutional: She is oriented to person, place, and time. She appears well-developed and well-nourished. She appears distressed.  HENT:  Right Ear: External ear normal.  Left Ear: External ear normal.  Mouth/Throat: Oropharyngeal exudate present.  Eyes: Pupils are equal, round, and reactive to light. Conjunctivae are normal.  Neck: Normal range of motion. Neck supple.  Pulmonary/Chest: Effort normal.  Musculoskeletal: Normal range of motion.  Neurological: She is alert and oriented to person, place, and time.  Skin: Skin is warm and dry.  Nursing note and vitals reviewed.    UC Treatments / Results  Labs (all labs ordered are listed, but only abnormal results are displayed) Labs Reviewed - No data to display  EKG  EKG Interpretation None       Radiology No results found.  Procedures Procedures (including critical care time)  Medications Ordered in UC Medications  cefTRIAXone (ROCEPHIN) injection 1 g (not administered)  acetaminophen (TYLENOL) tablet 650 mg (650 mg Oral Given 11/17/16 1636)     Initial Impression / Assessment and Plan / UC Course  I have reviewed the triage vital signs and the nursing notes.  Pertinent labs & imaging results that were available during my care of the patient were reviewed by me and considered in my medical decision making (see chart for details).     Final Clinical Impressions(s) / UC Diagnoses   Final diagnoses:  Tonsillitis    New Prescriptions New Prescriptions   CEFDINIR (OMNICEF) 300 MG CAPSULE    Take 2 capsules (600 mg total) by mouth daily.    CHLORHEXIDINE (PERIDEX) 0.12 % SOLUTION    Use as directed 15 mLs in the mouth or throat 2 (two) times daily.     Controlled Substance Prescriptions [place] Controlled Substance Registry consulted? Not Applicable   Elvina SidleLauenstein, Kourtni Stineman, MD 11/17/16 1701

## 2016-11-17 NOTE — ED Triage Notes (Signed)
Pt  Reports   Symptoms  Of   sorethroat      And      Fever     With    Symptoms       X   sev   Days     Pt  Reports     Took  Some  Tylenol     Early this   Am

## 2016-11-17 NOTE — Discharge Instructions (Signed)
Try gargling with the chlorhexidine several times a day and spitting it out. Cold liquids usually stay down better.

## 2017-10-17 ENCOUNTER — Other Ambulatory Visit (HOSPITAL_COMMUNITY)
Admission: RE | Admit: 2017-10-17 | Discharge: 2017-10-17 | Disposition: A | Discharge status: Home / Self Care | Payer: Medicaid Other | Source: Ambulatory Visit | Attending: Medical | Admitting: Medical

## 2017-10-17 ENCOUNTER — Ambulatory Visit: Payer: Medicaid Other | Admitting: Medical

## 2017-10-17 ENCOUNTER — Encounter: Payer: Self-pay | Admitting: Medical

## 2017-10-17 VITALS — BP 121/79 | HR 68 | Ht 64.0 in | Wt 176.0 lb

## 2017-10-17 DIAGNOSIS — Z01419 Encounter for gynecological examination (general) (routine) without abnormal findings: Secondary | ICD-10-CM | POA: Insufficient documentation

## 2017-10-17 DIAGNOSIS — Z0000 Encounter for general adult medical examination without abnormal findings: Secondary | ICD-10-CM | POA: Diagnosis not present

## 2017-10-17 NOTE — Patient Instructions (Signed)

## 2017-10-17 NOTE — Progress Notes (Signed)
  Subjective:    Kristen Shaw is a 37 y.o. female who presents for an annual exam. The patient has no complaints today. The patient is sexually active. GYN screening history: last pap: approximate date 07/12/2012 and was normal. The patient wears seatbelts: yes. The patient participates in regular exercise: not asked. Has the patient ever been transfused or tattooed?: not asked. The patient reports that there is not domestic violence in her life. Patient has Nexplanon in place since 05/2016 and is having regular periods.   Menstrual History: OB History    Gravida  4   Para  4   Term  4   Preterm      AB      Living  4     SAB      TAB      Ectopic      Multiple      Live Births  4           Patient's last menstrual period was 09/15/2017 (exact date).    The following portions of the patient's history were reviewed and updated as appropriate: allergies, current medications, past family history, past medical history, past social history, past surgical history and problem list.  Review of Systems Pertinent items are noted in HPI.    Objective:  Physical Exam  Nursing note and vitals reviewed. Constitutional: She is oriented to person, place, and time. She appears well-developed and well-nourished. No distress.  HENT:  Head: Normocephalic and atraumatic.  Eyes: EOM are normal.  Neck: Neck supple. No thyromegaly present.  Cardiovascular: Normal rate, regular rhythm and normal heart sounds.  No murmur heard. Respiratory: Effort normal. No respiratory distress. She has no wheezes.  GI: Soft. Bowel sounds are normal. She exhibits no distension and no mass. There is no tenderness. There is no rebound and no guarding.  Genitourinary: Uterus is not enlarged and not tender. Cervix exhibits no motion tenderness, no discharge and no friability. Right adnexum displays no mass and no tenderness. Left adnexum displays no mass and no tenderness. No bleeding in the vagina. No  vaginal discharge found.  Neurological: She is alert and oriented to person, place, and time.  Skin: Skin is warm and dry. No erythema.  Psychiatric: She has a normal mood and affect.     Assessment:    Healthy female exam.   Pap smear obtained today  Nexplanon in place   Plan:     Await pap smear results.   Patient will be contacted with abnormal results Patient to follow-up for routine GYN exam in 1-2 years  Kathlene CoteWenzel, Ahkeem Goede N, PA-C 10/17/2017 5:17 PM

## 2017-10-19 LAB — CYTOLOGY - PAP
CHLAMYDIA, DNA PROBE: NEGATIVE
Diagnosis: NEGATIVE
HPV (WINDOPATH): NOT DETECTED
Neisseria Gonorrhea: NEGATIVE

## 2017-11-30 ENCOUNTER — Ambulatory Visit (HOSPITAL_COMMUNITY)
Admission: EM | Admit: 2017-11-30 | Discharge: 2017-11-30 | Disposition: A | Discharge status: Home / Self Care | Payer: Medicaid Other | Attending: Family Medicine | Admitting: Family Medicine

## 2017-11-30 ENCOUNTER — Encounter (HOSPITAL_COMMUNITY): Payer: Self-pay | Admitting: Emergency Medicine

## 2017-11-30 DIAGNOSIS — J329 Chronic sinusitis, unspecified: Secondary | ICD-10-CM | POA: Diagnosis not present

## 2017-11-30 DIAGNOSIS — H9202 Otalgia, left ear: Secondary | ICD-10-CM | POA: Diagnosis not present

## 2017-11-30 DIAGNOSIS — H9312 Tinnitus, left ear: Secondary | ICD-10-CM

## 2017-11-30 DIAGNOSIS — J310 Chronic rhinitis: Secondary | ICD-10-CM

## 2017-11-30 MED ORDER — CETIRIZINE HCL 5 MG PO TABS: 5.0000 mg | ORAL_TABLET | Freq: Every day | ORAL | 1 refills | Status: DC

## 2017-11-30 MED ORDER — FLUTICASONE PROPIONATE 50 MCG/ACT NA SUSP: 1.0000 | Freq: Every day | NASAL | 2 refills | Status: DC

## 2017-11-30 MED ORDER — PREDNISONE 10 MG (21) PO TBPK: ORAL_TABLET | ORAL | 0 refills | Status: DC

## 2017-11-30 NOTE — ED Triage Notes (Signed)
Pt sts left ear pain and "ringing in ear" x 6 months

## 2017-11-30 NOTE — ED Provider Notes (Signed)
[facility]    CSN: [id number] Arrival date & time: 11/30/17  1155     History   Chief Complaint Chief Complaint  Patient presents with  . Otalgia    HPI Kristen Shaw is a 37 y.o. female.   Patient is a 37 year old female with past medical history of migraines.  She presents with 6 to 8 months of intermittent left ear "buzzing:" She also suffers with chronic nasal congestion and swelling.  Reports she has trouble breathing out of her nose at times.  She does have some rhinorrhea at times and facial tenderness.  She denies any associated cough, congestion, fevers, chills, body aches, fatigue.  She reports that she uses Zyrtec daily and Flonase as needed.  She reports these medications intermittently work for her.  She reports she has seen a specialist for this.   She does not smoke  ROS per HPI      Past Medical History:  Diagnosis Date  . Migraines     Patient Active Problem List   Diagnosis Date Noted  . Migraines     History reviewed. No pertinent surgical history.  OB History    Gravida  4   Para  4   Term  4   Preterm      AB      Living  4     SAB      TAB      Ectopic      Multiple      Live Births  4            Home Medications    Prior to Admission medications   Medication Sig Start Date End Date Taking? Authorizing Provider  acetaminophen (TYLENOL) 325 MG tablet Take 650 mg by mouth every 6 (six) hours as needed.    [provider]  cetirizine (ZYRTEC) 5 MG tablet Take 1 tablet (5 mg total) by mouth daily. 11/30/17   Dahlia Byes A, NP  fluticasone (FLONASE) 50 MCG/ACT nasal spray Place 1 spray into both nostrils daily. 11/30/17   Dahlia Byes A, NP  ibuprofen (ADVIL,MOTRIN) 200 MG tablet Take 200 mg by mouth every 6 (six) hours as needed.    [provider]  predniSONE (STERAPRED UNI-PAK 21 TAB) 10 MG (21) TBPK tablet 6 tabs for 1 day, then 5 tabs for 1 das, then 4 tabs for 1 day, then 3 tabs for 1  day, 2 tabs for 1 day, then 1 tab for 1 day 11/30/17   Janace Aris, NP    Family History History reviewed. No pertinent family history.  Social History Social History   Tobacco Use  . Smoking status: Never Smoker  . Smokeless tobacco: Never Used  Substance Use Topics  . Alcohol use: No  . Drug use: No     Allergies   Banana and Tomato   Review of Systems Review of Systems   Physical Exam Triage Vital Signs ED Triage Vitals [11/30/17 1257]  Enc Vitals Group     BP 111/70     Pulse Rate 70     Resp 18     Temp 97.6 F (36.4 C)     Temp Source Oral     SpO2 99 %     Weight      Height      Head Circumference      Peak Flow      Pain Score      Pain Loc  Pain Edu?      Excl. in GC?    No data found.  Updated Vital Signs BP 111/70 (BP Location: Right Arm)   Pulse 70   Temp 97.6 F (36.4 C) (Oral)   Resp 18   SpO2 99%   Visual Acuity Right Eye Distance:   Left Eye Distance:   Bilateral Distance:    Right Eye Near:   Left Eye Near:    Bilateral Near:     Physical Exam  Constitutional: She is oriented to person, place, and time. She appears well-developed and well-nourished.  Nontoxic or ill-appearing.  HENT:  Head: Normocephalic and atraumatic.  Bilateral TMs translucent with cone of light.  Moderate nasal turbinate swelling to the left nares.  Severe nasal turbinate swelling to the right nares.  Some tenderness to maxillary sinuses.  No congesti or drainage noted in the passages.  No posterior oropharyngeal erythema, tonsillar swelling, exudates.   Eyes: Pupils are equal, round, and reactive to light. Conjunctivae are normal.  Neck: Normal range of motion.  No adenopathy  Pulmonary/Chest: Effort normal.  Musculoskeletal: Normal range of motion.  Neurological: She is alert and oriented to person, place, and time.  Skin: Skin is warm and dry.  Psychiatric: She has a normal mood and affect.  Nursing note and vitals reviewed.    UC  Treatments / Results  Labs (all labs ordered are listed, but only abnormal results are displayed) Labs Reviewed - No data to display  EKG None  Radiology No results found.  Procedures Procedures (including critical care time)  Medications Ordered in UC Medications - No data to display  Initial Impression / Assessment and Plan / UC Course  I have reviewed the triage vital signs and the nursing notes.  Pertinent labs & imaging results that were available during my care of the patient were reviewed by me and considered in my medical decision making (see chart for details).     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  No otitis media.  Severe nasal turbinate swelling.  Tenderness to maxillary sinuses.  We will go ahead and treat with prednisone for the inflammation and swelling.  We will have her continue Flonase daily and Zyrtec.  If she is not getting better she will need to follow-up with the ENT specialist.  Patient understanding and agreeable to plan. Final Clinical Impressions(s) / UC Diagnoses   Final diagnoses:  None     Discharge Instructions     It was nice meeting you!!  We will treat you with a round or steroids to see if this helps with the inflammation in your nose and swelling.  Continue the zyrtec every day and Flonase nasal spray.  Follow up with ENT if not better.     ED Prescriptions    Medication Sig Dispense Auth. Provider   predniSONE (STERAPRED UNI-PAK 21 TAB) 10 MG (21) TBPK tablet 6 tabs for 1 day, then 5 tabs for 1 das, then 4 tabs for 1 day, then 3 tabs for 1 day, 2 tabs for 1 day, then 1 tab for 1 day 21 tablet Prachi Oftedahl A, NP   fluticasone (FLONASE) 50 MCG/ACT nasal spray Place 1 spray into both nostrils daily. 16 g Derral Colucci A, NP   cetirizine (ZYRTEC) 5 MG tablet Take 1 tablet (5 mg total) by mouth daily. 30 tablet Dahlia Byes A, NP     Controlled Substance Prescriptions [place] Controlled Substance Registry consulted? Not  Applicable  Janace ArisBast, Magnolia Mattila A, NP 11/30/17 1437

## 2017-11-30 NOTE — Discharge Instructions (Addendum)
It was nice meeting you!!  We will treat you with a round or steroids to see if this helps with the inflammation in your nose and swelling.  Continue the zyrtec every day and Flonase nasal spray.  Follow up with ENT if not better.

## 2018-02-06 NOTE — Congregational Nurse Program (Signed)
Ms Kristen Shaw in today blood pressure check and has received flu vaccine as well. Arman Bogus RN BSN PCCN  [phone]

## 2018-03-06 NOTE — Progress Notes (Signed)
This encounter was created in error - please disregard.

## 2018-03-21 DIAGNOSIS — G43909 Migraine, unspecified, not intractable, without status migrainosus: Secondary | ICD-10-CM | POA: Insufficient documentation

## 2019-04-24 ENCOUNTER — Encounter: Payer: Self-pay | Admitting: *Deleted

## 2019-05-02 ENCOUNTER — Encounter: Payer: Self-pay | Admitting: Medical

## 2019-05-02 ENCOUNTER — Other Ambulatory Visit: Payer: Self-pay

## 2019-05-02 ENCOUNTER — Ambulatory Visit (INDEPENDENT_AMBULATORY_CARE_PROVIDER_SITE_OTHER): Payer: Medicaid Other | Admitting: Medical

## 2019-05-02 VITALS — BP 127/91 | HR 76 | Ht 66.0 in | Wt 173.3 lb

## 2019-05-02 DIAGNOSIS — Z3046 Encounter for surveillance of implantable subdermal contraceptive: Secondary | ICD-10-CM

## 2019-05-02 DIAGNOSIS — Z3009 Encounter for other general counseling and advice on contraception: Secondary | ICD-10-CM

## 2019-05-02 MED ORDER — NORGESTIMATE-ETH ESTRADIOL 0.25-35 MG-MCG PO TABS: 1.0000 | ORAL_TABLET | Freq: Every day | ORAL | 5 refills | Status: DC

## 2019-05-02 NOTE — Progress Notes (Signed)
GYNECOLOGY CLINIC PROCEDURE NOTE  Ms. Kristen Shaw is a 39 y.o. [id number] here for Nexplanon removal. No GYN concerns.  Last pap smear was on 10/2017 and was normal.    Nexplanon Removal Patient was given informed consent for removal of her Nexplanon.  Appropriate time out taken. Nexplanon site identified.  Area prepped in usual sterile fashon. Three ml of 1% lidocaine was used to anesthetize the area at the distal end of the implant. A small stab incision was made right beside the implant on the distal portion.  The Nexplanon rod was grasped using hemostats and removed without difficulty.  There was minimal blood loss. There were no complications.  Steri-strips were applied over the small incision.  A pressure bandage was applied to reduce any bruising.  The patient tolerated the procedure well and was given post procedure instructions.  Patient is planning to use OCPS for contraception. Discussed increased risk of clots with OCP use at 39 y.o. Patient denies history of HTN. Will return in 2 weeks for BP check. Planning to try to conceive in ~ 6 months.    Marny Lowenstein, PA-C 05/02/2019 2:05 PM

## 2019-05-02 NOTE — Patient Instructions (Signed)

## 2019-05-16 ENCOUNTER — Ambulatory Visit: Payer: Medicaid Other

## 2019-06-14 ENCOUNTER — Encounter (HOSPITAL_COMMUNITY): Payer: Self-pay | Admitting: Family Medicine

## 2019-06-14 ENCOUNTER — Telehealth (HOSPITAL_COMMUNITY): Payer: Self-pay | Admitting: Emergency Medicine

## 2019-06-14 ENCOUNTER — Ambulatory Visit (HOSPITAL_COMMUNITY)
Admission: EM | Admit: 2019-06-14 | Discharge: 2019-06-14 | Disposition: A | Discharge status: Home / Self Care | Payer: Medicaid Other | Attending: Family Medicine | Admitting: Family Medicine

## 2019-06-14 ENCOUNTER — Other Ambulatory Visit: Payer: Self-pay

## 2019-06-14 ENCOUNTER — Encounter: Payer: Self-pay | Admitting: General Practice

## 2019-06-14 DIAGNOSIS — R519 Headache, unspecified: Secondary | ICD-10-CM | POA: Diagnosis not present

## 2019-06-14 MED ORDER — PREDNISONE 10 MG (21) PO TBPK: ORAL_TABLET | ORAL | 0 refills | Status: DC

## 2019-06-14 MED ORDER — DEXAMETHASONE SODIUM PHOSPHATE 10 MG/ML IJ SOLN
INTRAMUSCULAR | Status: AC
  Filled 2019-06-14: qty 1

## 2019-06-14 MED ORDER — OLOPATADINE HCL 0.2 % OP SOLN: 1.0000 [drp] | Freq: Once | OPHTHALMIC | 0 refills | Status: AC

## 2019-06-14 MED ORDER — KETOROLAC TROMETHAMINE 60 MG/2ML IM SOLN
30.0000 mg | Freq: Once | INTRAMUSCULAR | Status: AC
  Administered 2019-06-14: 14:00:00 30 mg via INTRAMUSCULAR

## 2019-06-14 MED ORDER — OLOPATADINE HCL 0.2 % OP SOLN: 1.0000 [drp] | Freq: Once | OPHTHALMIC | 0 refills | Status: DC

## 2019-06-14 MED ORDER — KETOROLAC TROMETHAMINE 30 MG/ML IJ SOLN
INTRAMUSCULAR | Status: AC
  Filled 2019-06-14: qty 1

## 2019-06-14 MED ORDER — DEXAMETHASONE SODIUM PHOSPHATE 10 MG/ML IJ SOLN
10.0000 mg | Freq: Once | INTRAMUSCULAR | Status: AC
  Administered 2019-06-14: 10 mg via INTRAMUSCULAR

## 2019-06-14 NOTE — ED Triage Notes (Signed)
Headache 2 days ago, watery eyes.   Menstrual cycle started 4 days ago, and is normal time for this.  Patient associates severe headache with menstrual cycle.  This headache is no different than other headaches, its just bothering her.  Patient has tried ibuprofen, no relief  Denies runny nose, denies cough

## 2019-06-14 NOTE — Discharge Instructions (Addendum)
Continue using your allergy medicine We will add in some allergy eyedrops. Take the prednisone as prescribed starting tomorrow. Giving  you medicine here today for your headache, given 2 injections to include 1 pain medication and one steroid. If this problem continues you will need to follow-up with your doctor

## 2019-06-18 NOTE — ED Provider Notes (Signed)
[facility]    CSN: [id number] Arrival date & time: 06/14/19  1241      History   Chief Complaint Chief Complaint  Patient presents with  . Headache    HPI Kristen Shaw is a 39 y.o. female.   Patient is a 39 year old female presents today with headache.  This is been an ongoing issue for her for some time.  This headache started approximate 2 days ago.  She is also had some itchy, watery eyes with mild scleral injection.  Some nasal congestion from time to time.  Reporting sometimes she gets headaches associated with her menstrual cycle.  She started her menstrual cycle approximately 4 days ago.  Denies any associated fever, rhinorrhea, cough, chest congestion, photophobia, phonophobia, nausea or vomiting.  Denies any dizziness or blurred vision.  Uses Flonase and zyrtec with some relief.   All information obtained using the translator  ROS per HPI      Past Medical History:  Diagnosis Date  . Migraines     Patient Active Problem List   Diagnosis Date Noted  . Migraines     History reviewed. No pertinent surgical history.  OB History    Gravida  4   Para  4   Term  4   Preterm      AB      Living  4     SAB      TAB      Ectopic      Multiple      Live Births  4            Home Medications    Prior to Admission medications   Medication Sig Start Date End Date Taking? Authorizing Provider  cetirizine (ZYRTEC) 5 MG tablet Take 1 tablet (5 mg total) by mouth daily. 11/30/17  Yes Idalia Allbritton A, NP  fluticasone (FLONASE) 50 MCG/ACT nasal spray Place 1 spray into both nostrils daily. 11/30/17  Yes Iceis Knab A, NP  ibuprofen (ADVIL,MOTRIN) 200 MG tablet Take 200 mg by mouth every 6 (six) hours as needed.   Yes [provider]  acetaminophen (TYLENOL) 325 MG tablet Take 650 mg by mouth every 6 (six) hours as needed.    [provider]  norgestimate-ethinyl estradiol (ORTHO-CYCLEN) 0.25-35 MG-MCG tablet Take 1  tablet by mouth daily. 05/02/19   Marny Lowenstein, PA-C  predniSONE (STERAPRED UNI-PAK 21 TAB) 10 MG (21) TBPK tablet 6 tabs for 1 day, then 5 tabs for 1 das, then 4 tabs for 1 day, then 3 tabs for 1 day, 2 tabs for 1 day, then 1 tab for 1 day 06/14/19   Janace Aris, NP    Family History History reviewed. No pertinent family history.  Social History Social History   Tobacco Use  . Smoking status: Never Smoker  . Smokeless tobacco: Never Used  Substance Use Topics  . Alcohol use: No  . Drug use: No     Allergies   Banana and Tomato   Review of Systems Review of Systems   Physical Exam Triage Vital Signs ED Triage Vitals  Enc Vitals Group     BP 06/14/19 1301 136/87     Pulse Rate 06/14/19 1301 70     Resp 06/14/19 1301 16     Temp 06/14/19 1301 98.8 F (37.1 C)     Temp Source 06/14/19 1301 Oral     SpO2 06/14/19 1301 100 %     Weight --  Height --      Head Circumference --      Peak Flow --      Pain Score 06/14/19 1324 10     Pain Loc --      Pain Edu? --      Excl. in GC? --    No data found.  Updated Vital Signs BP 136/87 (BP Location: Left Arm)   Pulse 70   Temp 98.8 F (37.1 C) (Oral)   Resp 16   LMP 06/11/2019   SpO2 100%   Visual Acuity Right Eye Distance:   Left Eye Distance:   Bilateral Distance:    Right Eye Near:   Left Eye Near:    Bilateral Near:     Physical Exam Vitals and nursing note reviewed.  Constitutional:      General: She is not in acute distress.    Appearance: She is well-developed. She is not ill-appearing, toxic-appearing or diaphoretic.  HENT:     Head: Normocephalic and atraumatic.  Eyes:     Extraocular Movements: Extraocular movements intact.     Pupils: Pupils are equal, round, and reactive to light.     Comments: Bilateral mild scleral injection with tearing  Cardiovascular:     Rate and Rhythm: Normal rate and regular rhythm.  Pulmonary:     Effort: Pulmonary effort is normal.     Breath sounds:  Normal breath sounds.  Musculoskeletal:        General: Normal range of motion.  Skin:    General: Skin is warm and dry.  Neurological:     Mental Status: She is alert.     Cranial Nerves: No cranial nerve deficit or facial asymmetry.      UC Treatments / Results  Labs (all labs ordered are listed, but only abnormal results are displayed) Labs Reviewed - No data to display  EKG   Radiology No results found.  Procedures Procedures (including critical care time)  Medications Ordered in UC Medications  ketorolac (TORADOL) injection 30 mg (30 mg Intramuscular Given 06/14/19 1424)  dexamethasone (DECADRON) injection 10 mg (10 mg Intramuscular Given 06/14/19 1423)    Initial Impression / Assessment and Plan / UC Course  I have reviewed the triage vital signs and the nursing notes.  Pertinent labs & imaging results that were available during my care of the patient were reviewed by me and considered in my medical decision making (see chart for details).     Headache-most likely sinus and allergy related.  Treating headache with Toradol and Decadron here in clinic. No concerning signs or symptoms on exam.  Vital signs stable. No neuro deficit Sending home with prednisone taper to start tomorrow Also prescribing allergy eyedrops to use in addition to the Flonase and Zyrtec. Recommended follow-up with her primary care for any continued or worsening problems  Information explained and questions answered using the translator Final Clinical Impressions(s) / UC Diagnoses   Final diagnoses:  Sinus headache     Discharge Instructions     Continue using your allergy medicine We will add in some allergy eyedrops. Take the prednisone as prescribed starting tomorrow. Giving  you medicine here today for your headache, given 2 injections to include 1 pain medication and one steroid. If this problem continues you will need to follow-up with your doctor    ED Prescriptions     Medication Sig Dispense Auth. Provider   predniSONE (STERAPRED UNI-PAK 21 TAB) 10 MG (21) TBPK tablet 6 tabs for 1  day, then 5 tabs for 1 das, then 4 tabs for 1 day, then 3 tabs for 1 day, 2 tabs for 1 day, then 1 tab for 1 day 21 tablet Lestat Golob A, NP   Olopatadine HCl 0.2 % SOLN Apply 1 drop to eye once for 1 dose. 1 drop in both eyes daily 2.5 mL Alexy Bringle A, NP     PDMP not reviewed this encounter.   Orvan July, NP 06/18/19 [phone]

## 2019-07-04 ENCOUNTER — Other Ambulatory Visit: Payer: Self-pay

## 2019-07-04 ENCOUNTER — Ambulatory Visit (INDEPENDENT_AMBULATORY_CARE_PROVIDER_SITE_OTHER): Payer: Medicaid Other

## 2019-07-04 VITALS — BP 124/81 | HR 78 | Wt 175.1 lb

## 2019-07-04 DIAGNOSIS — Z3042 Encounter for surveillance of injectable contraceptive: Secondary | ICD-10-CM | POA: Diagnosis present

## 2019-07-04 LAB — POCT PREGNANCY, URINE: Preg Test, Ur: NEGATIVE

## 2019-07-04 MED ORDER — MEDROXYPROGESTERONE ACETATE 150 MG/ML IM SUSP
150.0000 mg | Freq: Once | INTRAMUSCULAR | Status: AC
  Administered 2019-07-04: 150 mg via INTRAMUSCULAR

## 2019-07-04 NOTE — Progress Notes (Signed)
Kristen Shaw here for Depo-Provera  Injection.  Injection administered without complication. Patient will return in 3 months for next injection.  Video Interpreter # [id number].  Pt reports that she had intercourse two weeks ago with a condom.    Ralene Bathe, RN 07/04/2019  9:39 AM

## 2019-08-10 ENCOUNTER — Emergency Department (HOSPITAL_COMMUNITY): Payer: Medicaid Other

## 2019-08-10 ENCOUNTER — Emergency Department (HOSPITAL_COMMUNITY)
Admission: EM | Admit: 2019-08-10 | Discharge: 2019-08-10 | Disposition: A | Discharge status: Home / Self Care | Payer: Medicaid Other | Attending: Emergency Medicine | Admitting: Emergency Medicine

## 2019-08-10 ENCOUNTER — Encounter (HOSPITAL_COMMUNITY): Payer: Self-pay

## 2019-08-10 ENCOUNTER — Other Ambulatory Visit: Payer: Self-pay

## 2019-08-10 DIAGNOSIS — R1110 Vomiting, unspecified: Secondary | ICD-10-CM | POA: Diagnosis not present

## 2019-08-10 DIAGNOSIS — Z20822 Contact with and (suspected) exposure to covid-19: Secondary | ICD-10-CM | POA: Insufficient documentation

## 2019-08-10 DIAGNOSIS — Z79899 Other long term (current) drug therapy: Secondary | ICD-10-CM | POA: Diagnosis not present

## 2019-08-10 DIAGNOSIS — R1011 Right upper quadrant pain: Secondary | ICD-10-CM | POA: Diagnosis not present

## 2019-08-10 DIAGNOSIS — R1013 Epigastric pain: Secondary | ICD-10-CM | POA: Diagnosis present

## 2019-08-10 DIAGNOSIS — Z793 Long term (current) use of hormonal contraceptives: Secondary | ICD-10-CM | POA: Diagnosis not present

## 2019-08-10 LAB — URINALYSIS, ROUTINE W REFLEX MICROSCOPIC
Bilirubin Urine: NEGATIVE
Glucose, UA: NEGATIVE mg/dL
Hgb urine dipstick: NEGATIVE
Ketones, ur: NEGATIVE mg/dL
Leukocytes,Ua: NEGATIVE
Nitrite: NEGATIVE
Protein, ur: NEGATIVE mg/dL
Specific Gravity, Urine: 1.02 (ref 1.005–1.030)
pH: 8 (ref 5.0–8.0)

## 2019-08-10 LAB — COMPREHENSIVE METABOLIC PANEL
ALT: 19 U/L (ref 0–44)
AST: 17 U/L (ref 15–41)
Albumin: 3.8 g/dL (ref 3.5–5.0)
Alkaline Phosphatase: 58 U/L (ref 38–126)
Anion gap: 11 (ref 5–15)
BUN: 8 mg/dL (ref 6–20)
CO2: 23 mmol/L (ref 22–32)
Calcium: 9.2 mg/dL (ref 8.9–10.3)
Chloride: 104 mmol/L (ref 98–111)
Creatinine, Ser: 0.69 mg/dL (ref 0.44–1.00)
GFR calc Af Amer: >60 mL/min (ref >60)
GFR calc non Af Amer: >60 mL/min (ref >60)
Glucose, Bld: 129 mg/dL — ABNORMAL HIGH (ref 70–99)
Potassium: 3.4 mmol/L — ABNORMAL LOW (ref 3.5–5.1)
Sodium: 138 mmol/L (ref 135–145)
Total Bilirubin: 0.6 mg/dL (ref 0.3–1.2)
Total Protein: 8.1 g/dL (ref 6.5–8.1)

## 2019-08-10 LAB — CBC WITH DIFFERENTIAL/PLATELET
Abs Immature Granulocytes: 0.01 10*3/uL (ref 0.00–0.07)
Basophils Absolute: 0 10*3/uL (ref 0.0–0.1)
Basophils Relative: 0 %
Eosinophils Absolute: 0.1 10*3/uL (ref 0.0–0.5)
Eosinophils Relative: 2 %
HCT: 40.5 % (ref 36.0–46.0)
Hemoglobin: 13.1 g/dL (ref 12.0–15.0)
Immature Granulocytes: 0 %
Lymphocytes Relative: 23 %
Lymphs Abs: 1.4 10*3/uL (ref 0.7–4.0)
MCH: 26.6 pg (ref 26.0–34.0)
MCHC: 32.3 g/dL (ref 30.0–36.0)
MCV: 82.3 fL (ref 80.0–100.0)
Monocytes Absolute: 0.3 10*3/uL (ref 0.1–1.0)
Monocytes Relative: 5 %
Neutro Abs: 4.3 10*3/uL (ref 1.7–7.7)
Neutrophils Relative %: 70 %
Platelets: 307 10*3/uL (ref 150–400)
RBC: 4.92 MIL/uL (ref 3.87–5.11)
RDW: 13.2 % (ref 11.5–15.5)
WBC: 6.1 10*3/uL (ref 4.0–10.5)
nRBC: 0 % (ref 0.0–0.2)

## 2019-08-10 LAB — RESPIRATORY PANEL BY RT PCR (FLU A&B, COVID)
Influenza A by PCR: NEGATIVE
Influenza B by PCR: NEGATIVE
SARS Coronavirus 2 by RT PCR: NEGATIVE

## 2019-08-10 LAB — LIPASE, BLOOD: Lipase: 38 U/L (ref 11–51)

## 2019-08-10 MED ORDER — AMOXICILLIN-POT CLAVULANATE 875-125 MG PO TABS
1.0000 | ORAL_TABLET | Freq: Two times a day (BID) | ORAL | 0 refills | Status: AC
Start: 2019-08-10 — End: 2019-08-17

## 2019-08-10 MED ORDER — ONDANSETRON HCL 4 MG PO TABS: 4.0000 mg | ORAL_TABLET | Freq: Every day | ORAL | 1 refills | Status: AC | PRN

## 2019-08-10 MED ORDER — FENTANYL CITRATE (PF) 100 MCG/2ML IJ SOLN
50.0000 ug | Freq: Once | INTRAMUSCULAR | Status: AC
  Administered 2019-08-10: 10:00:00 50 ug via INTRAVENOUS
  Filled 2019-08-10: qty 2

## 2019-08-10 MED ORDER — FENTANYL CITRATE (PF) 100 MCG/2ML IJ SOLN
25.0000 ug | Freq: Once | INTRAMUSCULAR | Status: AC
  Administered 2019-08-10: 25 ug via INTRAVENOUS
  Filled 2019-08-10: qty 2

## 2019-08-10 MED ORDER — HYDROCODONE-ACETAMINOPHEN 5-325 MG PO TABS: 1.0000 | ORAL_TABLET | ORAL | 0 refills | Status: AC | PRN

## 2019-08-10 NOTE — ED Triage Notes (Signed)
Pt reports 3 days of epigastric pain that radiates to her RUQ, pain is worse after meals. Pt also reports multiple emesis episodes. Denies changes in bowel or bladder. LMP last month.

## 2019-08-10 NOTE — Consult Note (Signed)
Kristen Shaw 09-27-1980  [id number].    Requesting MD: Dr. Carmin Muskrat Chief Complaint/Reason for Consult: cholecystitis  HPI:  This is a 39 yo [place] female who states she started having nausea and vomiting along with some RUQ abdominal pain.  She is in the middle of Ramadan and has been fasting some during the day and drinking a lot of "sour juice."  She states that she thinks the juice has been contributing to her symptoms.  She denies any diarrhea, fevers, chills, CP, SOB, dysuria, etc at home.  She denies any COVID exposure.  Due to worsening pain today she presented to the ED.  Her labs are all normal, but she has an [place] that reveals gb wall thickening of 8mm with pericholecystic fluid and edema c/w cholecystitis.  We have been asked to see her for further evaluation and recommendations.   ROS: ROS: Please see HPI, otherwise all other systems reviewed and are negative.  History reviewed. No pertinent family history.  Past Medical History:  Diagnosis Date  . Migraines     History reviewed. No pertinent surgical history.  Social History:  reports that she has never smoked. She has never used smokeless tobacco. She reports that she does not drink alcohol or use drugs.  Allergies:  Allergies  Allergen Reactions  . Banana Other (See Comments)    Stomach upset.  Marland Kitchen Penicillin G Itching  . Tomato Nausea And Vomiting    (Not in a hospital admission)    Physical Exam: Blood pressure (!) 120/92, pulse (!) 59, temperature (!) 97.4 F (36.3 C), temperature source Oral, resp. rate 14, height 5\' 6"  (1.676 m), last menstrual period 07/18/2019, SpO2 100 %. General: pleasant, WD, WN black female who is laying in bed in NAD HEENT: head is normocephalic, atraumatic.  Sclera are noninjected.  PERRL.  Ears and nose without any masses or lesions.  Mouth is pink and moist Heart: regular, rate, and rhythm.  Normal s1,s2. No obvious murmurs, gallops, or rubs noted.  Palpable  radial and pedal pulses bilaterally Lungs: CTAB, no wheezes, rhonchi, or rales noted.  Respiratory effort nonlabored Abd: soft, NT in RUQ, but just had pain medication, ND, +BS, no masses, hernias, or organomegaly MS: all 4 extremities are symmetrical with no cyanosis, clubbing, or edema. Skin: warm and dry with no masses, lesions, or rashes Neuro: Cranial nerves 2-12 grossly intact, sensation is normal throughout Psych: A&Ox3 with an appropriate affect.   Results for orders placed or performed during the hospital encounter of 08/10/19 (from the past 48 hour(s))  Urinalysis, Routine w reflex microscopic     Status: Abnormal   Collection Time: 08/10/19  8:38 AM  Result Value Ref Range   Color, Urine YELLOW YELLOW   APPearance CLOUDY (A) CLEAR   Specific Gravity, Urine 1.020 1.005 - 1.030   pH 8.0 5.0 - 8.0   Glucose, UA NEGATIVE NEGATIVE mg/dL   Hgb urine dipstick NEGATIVE NEGATIVE   Bilirubin Urine NEGATIVE NEGATIVE   Ketones, ur NEGATIVE NEGATIVE mg/dL   Protein, ur NEGATIVE NEGATIVE mg/dL   Nitrite NEGATIVE NEGATIVE   Leukocytes,Ua NEGATIVE NEGATIVE    Comment: Microscopic not done on urines with negative protein, blood, leukocytes, nitrite, or glucose < 500 mg/dL. Performed at [facility], [place] [address] [place], [place] [address] CORRECTED ON 05/07 AT 0959: PREVIOUSLY REPORTED AS NEGATIVE   CBC with Differential     Status: None   Collection Time: 08/10/19  8:57 AM  Result  Value Ref Range   WBC 6.1 4.0 - 10.5 K/uL   RBC 4.92 3.87 - 5.11 MIL/uL   Hemoglobin 13.1 12.0 - 15.0 g/dL   HCT 16.1 09.6 - 04.5 %   MCV 82.3 80.0 - 100.0 fL   MCH 26.6 26.0 - 34.0 pg   MCHC 32.3 30.0 - 36.0 g/dL   RDW 40.9 81.1 - 91.4 %   Platelets 307 150 - 400 K/uL   nRBC 0.0 0.0 - 0.2 %   Neutrophils Relative % 70 %   Neutro Abs 4.3 1.7 - 7.7 K/uL   Lymphocytes Relative 23 %   Lymphs Abs 1.4 0.7 - 4.0 K/uL   Monocytes Relative 5 %   Monocytes Absolute 0.3 0.1 - 1.0 K/uL   Eosinophils  Relative 2 %   Eosinophils Absolute 0.1 0.0 - 0.5 K/uL   Basophils Relative 0 %   Basophils Absolute 0.0 0.0 - 0.1 K/uL   Immature Granulocytes 0 %   Abs Immature Granulocytes 0.01 0.00 - 0.07 K/uL    Comment: Performed at [facility], [address] [place], [place] [address]  Comprehensive metabolic panel     Status: Abnormal   Collection Time: 08/10/19  8:57 AM  Result Value Ref Range   Sodium 138 135 - 145 mmol/L   Potassium 3.4 (L) 3.5 - 5.1 mmol/L   Chloride 104 98 - 111 mmol/L   CO2 23 22 - 32 mmol/L   Glucose, Bld 129 (H) 70 - 99 mg/dL    Comment: Glucose reference range applies only to samples taken after fasting for at least 8 hours.   BUN 8 6 - 20 mg/dL   Creatinine, Ser 6.21 0.44 - 1.00 mg/dL   Calcium 9.2 8.9 - 30.8 mg/dL   Total Protein 8.1 6.5 - 8.1 g/dL   Albumin 3.8 3.5 - 5.0 g/dL   AST 17 15 - 41 U/L   ALT 19 0 - 44 U/L   Alkaline Phosphatase 58 38 - 126 U/L   Total Bilirubin 0.6 0.3 - 1.2 mg/dL   GFR calc non Af Amer >60 >60 mL/min   GFR calc Af Amer >60 >60 mL/min   Anion gap 11 5 - 15    Comment: Performed at [facility], [address], [place], [place] [address]  Lipase, blood     Status: None   Collection Time: 08/10/19  8:57 AM  Result Value Ref Range   Lipase 38 11 - 51 U/L    Comment: Performed at [facility], [address], [place], [place] [address]   US Abdomen Limited RUQ  Result Date: 08/10/2019 CLINICAL DATA:  Right upper quadrant abdominal pain for 4 days EXAM: ULTRASOUND ABDOMEN LIMITED RIGHT UPPER QUADRANT COMPARISON:  None. FINDINGS: Gallbladder: Multiple echogenic, shadowing stones within the gallbladder lumen. Diffuse gallbladder wall thickening measuring up to 7 mm. Pericholecystic edema. A positive sonographic Eulah Pont sign was noted. Common bile duct: Diameter: 5 mm. No stone is seen within the visualized common bile duct. Liver: No focal lesion identified. Within normal limits in parenchymal echogenicity. Portal vein  is patent on color Doppler imaging with normal direction of blood flow towards the liver. Other: None. IMPRESSION: Findings highly suggestive of acute calculus cholecystitis. Electronically Signed   By: Duanne Guess D.O.   On: 08/10/2019 09:51      Assessment/Plan Calculous Cholecystitis The patient has sonographic evidence of cholecystitis.  We offered a cholecystectomy as the first line treatment for cholecystitis  especially in the setting of gallstones.  The patient states that she does not want to proceed with surgery at this time, that she would like to try abx therapy first and then if this fails she will have surgery.  We discussed multiple times that because of her gallstones, this may help initially, but this is very likely to return or not be successful.  She understands this but very pleasantly declines surgery and states that she wants to try abx therapy first.  This has been discussed with MD.  Given she has normal labs and is not ill-appearing, she can go home on oral abx therapy.  She is given return precautions of what to look for incase she fails to improve.  If she improves, then she can always follow up with [place] in the office as an outpatient to discuss lap chole in the future.   Letha Cape, [facility] 08/10/2019, 11:50 AM Please see Amion for pager number during day hours 7:00am-4:30pm or 7:00am -11:30am on weekends

## 2019-08-10 NOTE — ED Provider Notes (Signed)
[facility] EMERGENCY DEPARTMENT Provider Note   CSN: [id number] Arrival date & time: 08/10/19  2841    History Chief Complaint  Patient presents with  . Abdominal Pain   Kristen Shaw is a 39 y.o. female.  Kristen Shaw is a pleasant 39 yo F presenting with recent onset of epigastric and RUQ pain.  She presents with a few weeks of epigastric and right upper quadrant abdominal pain worse after eating.  She has been observing Ramadan and feels well during the day when she is fasting.  However, after eating in the evening she develops abdominal pain overnight that interferes with sleeping.  She endorses associated nausea and non-bloody, non-biliary vomiting.  She says the pain does not radiate to her back or down to her groin.  She denies chest pain, shortness of breath, dysuria and any chance she could be pregnant.  She has never had pain like this before. She denies constipation and diarrhea. She reports her sx are not worse with fatty meals but occur when any food. No one in her family has been sick. She still has her gallbladder and has never had surgery.      Past Medical History:  Diagnosis Date  . Migraines    Patient Active Problem List   Diagnosis Date Noted  . Migraines    History reviewed. No pertinent surgical history.  Denies prior surgery. Has never had her gallbladder removed.  OB History    Gravida  4   Para  4   Term  4   Preterm      AB      Living  4     SAB      TAB      Ectopic      Multiple      Live Births  4           History reviewed. No pertinent family history.  Social History   Tobacco Use  . Smoking status: Never Smoker  . Smokeless tobacco: Never Used  Substance Use Topics  . Alcohol use: No  . Drug use: No   Lives at home with husband and kids.  Does not smoke, drink or use drugs.  Home Medications Prior to Admission medications   Medication Sig Start Date End Date Taking? Authorizing  Provider  cetirizine (ZYRTEC) 5 MG tablet Take 1 tablet (5 mg total) by mouth daily. Patient taking differently: Take 5 mg by mouth daily as needed for allergies.  11/30/17  Yes Bast, Traci A, NP  fluticasone (FLONASE) 50 MCG/ACT nasal spray Place 1 spray into both nostrils daily. Patient taking differently: Place 1 spray into both nostrils daily as needed for allergies or rhinitis.  11/30/17  Yes Bast, Traci A, NP  ibuprofen (ADVIL) 800 MG tablet Take 800 mg by mouth 3 (three) times daily. 06/22/19  Yes [provider]  norgestimate-ethinyl estradiol (ORTHO-CYCLEN) 0.25-35 MG-MCG tablet Take 1 tablet by mouth daily. 05/02/19  Yes Marny Lowenstein, PA-C  SUMAtriptan (IMITREX) 100 MG tablet Take 100 mg by mouth See admin instructions. 1 tablet by mouth at onset of headache. May repeat in 2 hours, if needed. 06/22/19  Yes [provider]  topiramate (TOPAMAX) 50 MG tablet Take 50 mg by mouth daily as needed (migraine).  06/22/19  Yes [provider]  amoxicillin-clavulanate (AUGMENTIN) 875-125 MG tablet Take 1 tablet by mouth 2 (two) times daily for 7 days. 08/10/19 08/17/19  Jenell Milliner, MD  HYDROcodone-acetaminophen (NORCO/VICODIN) 5-325 MG  tablet Take 1 tablet by mouth every 4 (four) hours as needed for moderate pain. 08/10/19 08/09/20  Jenell Milliner, MD  Olopatadine HCl 0.2 % SOLN Apply 1 drop to eye daily. 06/16/19   [provider]  ondansetron (ZOFRAN) 4 MG tablet Take 1 tablet (4 mg total) by mouth daily as needed for nausea or vomiting. 08/10/19 08/09/20  Jenell Milliner, MD  predniSONE (STERAPRED UNI-PAK 21 TAB) 10 MG (21) TBPK tablet 6 tabs for 1 day, then 5 tabs for 1 das, then 4 tabs for 1 day, then 3 tabs for 1 day, 2 tabs for 1 day, then 1 tab for 1 day Patient not taking: Reported on 08/10/2019 06/14/19   Dahlia Byes A, NP   Allergies    Banana, Penicillin g, and Tomato  Review of Systems   Review of Systems  Constitutional: Negative for chills and fever.  HENT:  Negative for rhinorrhea and sore throat.   Respiratory: Negative for shortness of breath.   Cardiovascular: Negative for chest pain.  Gastrointestinal: Positive for abdominal pain, nausea and vomiting (non bloody non bilious). Negative for blood in stool, constipation and diarrhea.  Genitourinary: Negative for dysuria and pelvic pain.  Musculoskeletal: Negative for back pain.  Skin: Negative for rash.  Neurological: Negative for syncope.   Physical Exam Updated Vital Signs BP (!) 120/92   Pulse (!) 59   Temp (!) 97.4 F (36.3 C) (Oral)   Resp 14   Ht 5\' 6"  (1.676 m)   LMP 07/18/2019   SpO2 100%   BMI 28.26 kg/m   Physical Exam Vitals and nursing note reviewed.  Constitutional:      Appearance: She is well-developed.  HENT:     Head: Normocephalic and atraumatic.  Cardiovascular:     Rate and Rhythm: Normal rate and regular rhythm.     Heart sounds: No murmur.  Pulmonary:     Effort: Pulmonary effort is normal.     Breath sounds: Normal breath sounds.  Abdominal:     Palpations: Abdomen is soft.     Tenderness: There is abdominal tenderness in the right upper quadrant and epigastric area. There is no left CVA tenderness. Positive signs include Murphy's sign.  Skin:    General: Skin is warm and dry.  Neurological:     General: No focal deficit present.     Mental Status: She is alert.  Psychiatric:        Mood and Affect: Mood normal.    ED Results / Procedures / Treatments   Labs (all labs ordered are listed, but only abnormal results are displayed) Labs Reviewed  COMPREHENSIVE METABOLIC PANEL - Abnormal; Notable for the following components:      Result Value   Potassium 3.4 (*)    Glucose, Bld 129 (*)    All other components within normal limits  URINALYSIS, ROUTINE W REFLEX MICROSCOPIC - Abnormal; Notable for the following components:   APPearance CLOUDY (*)    All other components within normal limits  RESPIRATORY PANEL BY RT PCR (FLU A&B, COVID)  CBC  WITH DIFFERENTIAL/PLATELET  LIPASE, BLOOD  I-STAT BETA HCG BLOOD, ED ([facility], [facility], [facility] ONLY)   EKG EKG Interpretation  Date/Time:  Friday Aug 10 2019 09:05:05 EDT Ventricular Rate:  69 PR Interval:    QRS Duration: 80 QT Interval:  371 QTC Calculation: 398 R Axis:   57 Text Interpretation: Sinus rhythm Atrial premature complex Artifact T wave abnormality Abnormal ECG Confirmed by 05-27-1980 [phone]) on 08/10/2019 9:10:45  AM   Radiology US Abdomen Limited RUQ  Result Date: 08/10/2019 CLINICAL DATA:  Right upper quadrant abdominal pain for 4 days EXAM: ULTRASOUND ABDOMEN LIMITED RIGHT UPPER QUADRANT COMPARISON:  None. FINDINGS: Gallbladder: Multiple echogenic, shadowing stones within the gallbladder lumen. Diffuse gallbladder wall thickening measuring up to 7 mm. Pericholecystic edema. A positive sonographic Percell Miller sign was noted. Common bile duct: Diameter: 5 mm. No stone is seen within the visualized common bile duct. Liver: No focal lesion identified. Within normal limits in parenchymal echogenicity. Portal vein is patent on color Doppler imaging with normal direction of blood flow towards the liver. Other: None. IMPRESSION: Findings highly suggestive of acute calculus cholecystitis. Electronically Signed   By: Davina Poke D.O.   On: 08/10/2019 09:51    Procedures Procedures (including critical care time)  Medications Ordered in ED Medications  fentaNYL (SUBLIMAZE) injection 25 mcg (25 mcg Intravenous Given 08/10/19 0912)  fentaNYL (SUBLIMAZE) injection 50 mcg (50 mcg Intravenous Given 08/10/19 1029)   ED Course  I have reviewed the triage vital signs and the nursing notes.  Pertinent labs & imaging results that were available during my care of the patient were reviewed by me and considered in my medical decision making (see chart for details).    MDM Rules/Calculators/A&P                      Ms. Kolton is a pleasant 39 yo F presenting with recent onset of epigastric and RUQ  pain.  Abdominal pain: -epigastric/RUQ abdominal pain w/ N/V worse after eating -tender in epigastrium/RUQ on exam w/ + murphy sign -sx consistent with some hepatobiliary/gallbladder pathology, not overtly acute or infectious  -pt afebrile and nontoxic appearing lowering concern for acute cholecystitis/cholangitis -no diarrhea or fever so unlikely an acute infectious hepatitis -no fever or history of STI/PID or pelvic pain so concern for Rochele Raring is low -pt does not drink and pain does not radiate to the back so suspicion for pancreatitis is low -ACS unlikely as sx related to eating, pt denies chest pain and SOB  Plan: -EKG -CBC -CMP -lipase -pregnancy test -RUQ [place] -fentanyl for pain  Final Clinical Impression(s) / ED Diagnoses Final diagnoses:  RUQ pain  Vomiting, intractability of vomiting not specified, presence of nausea not specified, unspecified vomiting type  Epigastric abdominal pain   -PT afebrile, HDS, nontoxic; labs unremarkable but pain requiring multiple doses of fentanyl and [place] suggestive of acute calculus cholecystitis -General Surgery consulted and while they think it appropriate to admit for surgery, patient would like to try antibiotics first; as patient stable, surgery is ok with patient being discharged -will discharge on antibiotics and with medications for pain and nausea  Rx / DC Orders ED Discharge Orders         Ordered    amoxicillin-clavulanate (AUGMENTIN) 875-125 MG tablet  2 times daily     08/10/19 1150    HYDROcodone-acetaminophen (NORCO/VICODIN) 5-325 MG tablet  Every 4 hours PRN     08/10/19 1150    ondansetron (ZOFRAN) 4 MG tablet  Daily PRN     08/10/19 1150           Al Decant, MD 08/10/19 1157    Carmin Muskrat, MD 08/11/19 1303

## 2019-08-10 NOTE — Discharge Instructions (Signed)
You were seen today for abdominal pain.  You underwent imaging which demonstrated an inflamed gallbladder with gallstones.  We spoke to our general surgeons who recommended that you have surgery to remove your inflamed gallbladder.  Given you wish to avoid surgery if possible, we will try a course of antibiotics at this time.  We will prescribe those and send them to your pharmacy along with some pain medication and medication for nausea.  If you continue to have pain, it worsens, or you develop a fever, please call your doctor or return to the emergency department.  It was a pleasure to meet you.   Gallbladder Eating Plan If you have a gallbladder condition, you may have trouble digesting fats. Eating a low-fat diet can help reduce your symptoms, and may be helpful before and after having surgery to remove your gallbladder (cholecystectomy). Your health care provider may recommend that you work with a diet and nutrition specialist (dietitian) to help you reduce the amount of fat in your diet. What are tips for following this plan? General guidelines  Limit your fat intake to less than 30% of your total daily calories. If you eat around 1,800 calories each day, this is less than 60 grams (g) of fat per day.  Fat is an important part of a healthy diet. Eating a low-fat diet can make it hard to maintain a healthy body weight. Ask your dietitian how much fat, calories, and other nutrients you need each day.  Eat small, frequent meals throughout the day instead of three large meals.  Drink at least 8-10 cups of fluid a day. Drink enough fluid to keep your urine clear or pale yellow.  Limit alcohol intake to no more than 1 drink a day for nonpregnant women and 2 drinks a day for men. One drink equals 12 oz of beer, 5 oz of wine, or 1 oz of hard liquor. Reading food labels  Check Nutrition Facts on food labels for the amount of fat per serving. Choose foods with less than 3 grams of fat per  serving. Shopping  Choose nonfat and low-fat healthy foods. Look for the words "nonfat," "low fat," or "fat free."  Avoid buying processed or prepackaged foods. Cooking  Cook using low-fat methods, such as baking, broiling, grilling, or boiling.  Cook with small amounts of healthy fats, such as olive oil, grapeseed oil, canola oil, or sunflower oil. What foods are recommended?   All fresh, frozen, or canned fruits and vegetables.  Whole grains.  Low-fat or non-fat (skim) milk and yogurt.  Lean meat, skinless poultry, fish, eggs, and beans.  Low-fat protein supplement powders or drinks.  Spices and herbs. What foods are not recommended?  High-fat foods. These include baked goods, fast food, fatty cuts of meat, ice cream, french toast, sweet rolls, pizza, cheese bread, foods covered with butter, creamy sauces, or cheese.  Fried foods. These include french fries, tempura, battered fish, breaded chicken, fried breads, and sweets.  Foods with strong odors.  Foods that cause bloating and gas. Summary  A low-fat diet can be helpful if you have a gallbladder condition, or before and after gallbladder surgery.  Limit your fat intake to less than 30% of your total daily calories. This is about 60 g of fat if you eat 1,800 calories each day.  Eat small, frequent meals throughout the day instead of three large meals. This information is not intended to replace advice given to you by your health care provider. Make sure  you discuss any questions you have with your health care provider. Document Revised: 07/13/2018 Document Reviewed: 04/29/2016 Elsevier Patient Education  2020 [employer]vier Inc[employer]   Cholecystitis  Cholecystitis is irritation and swelling (inflammation) of the gallbladder. The gallbladder is an organ that is shaped like a pear. It is under the liver on the right side of the body. This organ stores bile. Bile helps the body break down (digest) the fats in food. This  condition can occur all of a sudden. It needs to be treated. What are the causes? This condition may be caused by stones or lumps that form in the gallbladder (gallstones). Gallstones can block the tube (duct) that carries bile out of your gallbladder. Other causes are:  Damage to the gallbladder due to less blood flow.  Germs in the bile ducts.  Scars or kinks in the bile ducts.  Abnormal growths (tumors) in the liver, pancreas, or gallbladder. What increases the risk? You are more likely to develop this condition if:  You have sickle cell disease.  You take birth control pills.  You use estrogen.  You have alcoholic liver disease.  You have liver cirrhosis.  You are being fed through a vein.  You are very ill.  You do not eat or drink for a long time. This is also called "fasting."  You are overweight (obese).  You lose weight too fast.  You are pregnant.  You have high levels of fat in the blood (triglycerides).  You have irritation and swelling of the pancreas (pancreatitis). What are the signs or symptoms? Symptoms of this condition include:  Pain in the belly (abdomen). Pain is often in the upper right area of the belly.  Tenderness or bloating in the belly.  Feeling sick to your stomach (nauseous).  Throwing up (vomiting).  Fever.  Chills. How is this diagnosed? This condition may be diagnosed with a medical history and exam. You may also have other tests, such as:  Imaging tests. This may include: ? Ultrasound. ? CT scan of the belly. ? Nuclear scan. This is also called a HIDA scan. This scan lets your doctor see the bile as it moves in your body. ? MRI.  Blood tests. These are done to check: ? Your blood count. The white blood cell count may be higher than normal. ? How well your liver works. How is this treated? This condition may be treated with:  Surgery to take out your gallbladder.  Antibiotic medicines to treat illnesses caused by  germs.  Going without food for some time.  Giving fluids through an IV tube.  Medicines to treat pain or throwing up. Follow these instructions at home:  If you had surgery, follow instructions from your doctor about how to care for yourself after you go home. Medicines   Take over-the-counter and prescription medicines only as told by your doctor.  If you were prescribed an antibiotic medicine, take it as told by your doctor. Do not stop taking it even if you start to feel better. General instructions  Follow instructions from your doctor about what to eat or drink. Do not eat or drink anything that makes you sick again.  Do not lift anything that is heavier than 10 lb (4.5 kg) until your doctor says that it is safe.  Do not use any products that contain nicotine or tobacco, such as cigarettes and e-cigarettes. If you need help quitting, ask your doctor.  Keep all follow-up visits as told by your doctor. This  is important. Contact a doctor if:  You have pain and your medicine does not help.  You have a fever. Get help right away if:  Your pain moves to: ? Another part of your belly. ? Your back.  Your symptoms do not go away.  You have new symptoms. Summary  Cholecystitis is swelling and irritation of the gallbladder.  This condition may be caused by stones or lumps that form in the gallbladder (gallstones).  Common symptoms are pain in the belly. You may feel sick to your stomach and start throwing up. You may also have a fever and chills.  This condition may be treated with surgery to take out the gallbladder. It may also be treated with medicines, fasting, and fluids through an IV tube.  Follow what you are told about eating and drinking. Do not eat things that make you sick again. This information is not intended to replace advice given to you by your health care provider. Make sure you discuss any questions you have with your health care provider. Document  Revised: 07/29/2017 Document Reviewed: 07/29/2017 Elsevier Patient Education  2020 [employer].

## 2019-09-20 ENCOUNTER — Ambulatory Visit (INDEPENDENT_AMBULATORY_CARE_PROVIDER_SITE_OTHER): Payer: Medicaid Other | Admitting: Lactation Services

## 2019-09-20 ENCOUNTER — Other Ambulatory Visit: Payer: Self-pay

## 2019-09-20 VITALS — Ht 64.5 in | Wt 167.6 lb

## 2019-09-20 DIAGNOSIS — R633 Feeding difficulties, unspecified: Secondary | ICD-10-CM

## 2019-09-20 DIAGNOSIS — Z3042 Encounter for surveillance of injectable contraceptive: Secondary | ICD-10-CM | POA: Diagnosis not present

## 2019-09-20 MED ORDER — MEDROXYPROGESTERONE ACETATE 150 MG/ML IM SUSP
150.0000 mg | Freq: Once | INTRAMUSCULAR | Status: AC
  Administered 2019-09-20: 150 mg via INTRAMUSCULAR

## 2019-09-20 NOTE — Progress Notes (Signed)
Gertie Baron here for Depo-Provera  Injection.  Injection administered without complication. Patient will return in 3 months for next injection. Patient tolerated well. She had no questions or concerns.   Ed Blalock, RN 09/20/2019  9:46 AM

## 2019-12-05 ENCOUNTER — Ambulatory Visit: Payer: Medicaid Other

## 2020-08-20 ENCOUNTER — Encounter: Payer: Self-pay | Admitting: *Deleted

## 2020-08-25 ENCOUNTER — Other Ambulatory Visit: Payer: Self-pay | Admitting: Internal Medicine

## 2020-08-26 ENCOUNTER — Ambulatory Visit: Payer: Medicaid Other | Admitting: Diagnostic Neuroimaging

## 2020-08-26 ENCOUNTER — Ambulatory Visit: Payer: Medicaid Other | Admitting: Neurology

## 2020-08-26 ENCOUNTER — Encounter: Payer: Self-pay | Admitting: Neurology

## 2020-08-26 VITALS — BP 120/87 | HR 74 | Ht 65.0 in | Wt 174.3 lb

## 2020-08-26 DIAGNOSIS — G43019 Migraine without aura, intractable, without status migrainosus: Secondary | ICD-10-CM

## 2020-08-26 DIAGNOSIS — E663 Overweight: Secondary | ICD-10-CM

## 2020-08-26 DIAGNOSIS — R519 Headache, unspecified: Secondary | ICD-10-CM

## 2020-08-26 DIAGNOSIS — R0683 Snoring: Secondary | ICD-10-CM

## 2020-08-26 MED ORDER — ONDANSETRON HCL 4 MG PO TABS: 4.0000 mg | ORAL_TABLET | Freq: Three times a day (TID) | ORAL | 0 refills | Status: DC | PRN

## 2020-08-26 MED ORDER — TOPIRAMATE 50 MG PO TABS: 150.0000 mg | ORAL_TABLET | Freq: Every day | ORAL | 3 refills | Status: DC

## 2020-08-26 NOTE — Progress Notes (Signed)
Subjective:    Patient ID: Kristen Shaw is a 40 y.o. female.  HPI     Huston Foley, MD, PhD [facility] [address], [address] [address] [place], [place] [address]  Dear Dr. Pollyann Kennedy,   I saw your patient, Kristen Shaw, upon your kind request, in my Neurologic clinic today for initial consultation of her recurrent headaches, concern for migraines.  The patient is accompanied by a Sudanese/Arabic interpreter today.  As you know, [name] is a 40 year old right-handed woman with an underlying medical history of cholecystitis, tinnitus, allergies, and overweight state, who reports recurrent headaches since she was in elementary school.  Headaches are often left-sided and throbbing, associated with nausea and light sensitivity, sometimes vomiting, also sound sensitivity, occasional blurry vision.  Frequency currently is around 3-4 times a week.  She has no other neurological accompaniments such as one-sided weakness or numbness or tingling or droopy face or slurring of speech.  She has occasional tearing and nasal congestion associated with her headache, typically on the left side.  She was given a prescription for Topamax from her primary care physician recently for 50 mg as needed.  She has taken it as needed.  She has also tried Imitrex about 4 times through her primary care physician, reports that it did not help and she also felt it made her sleepy, she woke up once with pain on her left side, in the flank area.  She has no visual symptoms such as blurry vision, double vision or loss of vision, sleeps fairly well but does snore per her husband's feedback and does not typically get 7 or 8 hours of sleep.  She is in bed generally between midnight and 1 AM and rise time is around 6:30 AM.  She lives with her family which includes her husband and 4 children.  She does not work.  She tries to hydrate well with water.  She is a non-smoker, does not utilize alcohol and  drinks caffeine, 1 large cup of coffee or sometimes 2 in the mornings.  She had a brain MRI with and without contrast through [facility] on 05/29/2018 and I was able to review the report, impression: Normal. She denies night to night nocturia but does wake up with a headache sometimes.  I reviewed your office note from 06/16/2020.  Her Past Medical History Is Significant For: Past Medical History:  Diagnosis Date  . Migraines     Her Past Surgical History Is Significant For: History reviewed. No pertinent surgical history.  Her Family History Is Significant For: History reviewed. No pertinent family history.  Her Social History Is Significant For: Social History   Socioeconomic History  . Marital status: Married    Spouse name: Not on file  . Number of children: Not on file  . Years of education: Not on file  . Highest education level: Not on file  Occupational History  . Not on file  Tobacco Use  . Smoking status: Never Smoker  . Smokeless tobacco: Never Used  Substance and Sexual Activity  . Alcohol use: No  . Drug use: No  . Sexual activity: Yes    Birth control/protection: None  Other Topics Concern  . Not on file  Social History Narrative  . Not on file   Social Determinants of Health   Financial Resource Strain: Not on file  Food Insecurity: Not on file  Transportation Needs: Not on file  Physical Activity: Not on file  Stress: Not on  file  Social Connections: Not on file    Her Allergies Are:  Allergies  Allergen Reactions  . Banana Other (See Comments)    Stomach upset.  Marland Kitchen Penicillin G Itching  . Tomato Nausea And Vomiting  :   Her Current Medications Are:  Outpatient Encounter Medications as of 08/26/2020  Medication Sig  . fluticasone (FLONASE) 50 MCG/ACT nasal spray Place 1 spray into both nostrils daily. (Patient taking differently: Place 1 spray into both nostrils daily as needed for allergies or rhinitis.)  . ibuprofen (ADVIL) 800 MG  tablet Take 800 mg by mouth 3 (three) times daily.  . ondansetron (ZOFRAN) 4 MG tablet Take 1 tablet (4 mg total) by mouth every 8 (eight) hours as needed for nausea or vomiting.  . [DISCONTINUED] topiramate (TOPAMAX) 50 MG tablet Take 50 mg by mouth daily as needed (migraine).   . topiramate (TOPAMAX) 50 MG tablet Take 3 tablets (150 mg total) by mouth at bedtime. Please follow instructions provided separately in writing  . [DISCONTINUED] cetirizine (ZYRTEC) 5 MG tablet Take 1 tablet (5 mg total) by mouth daily. (Patient not taking: Reported on 08/26/2020)  . [DISCONTINUED] norgestimate-ethinyl estradiol (ORTHO-CYCLEN) 0.25-35 MG-MCG tablet Take 1 tablet by mouth daily. (Patient not taking: Reported on 08/26/2020)  . [DISCONTINUED] Olopatadine HCl 0.2 % SOLN Apply 1 drop to eye daily. (Patient not taking: Reported on 08/26/2020)  . [DISCONTINUED] predniSONE (STERAPRED UNI-PAK 21 TAB) 10 MG (21) TBPK tablet 6 tabs for 1 day, then 5 tabs for 1 das, then 4 tabs for 1 day, then 3 tabs for 1 day, 2 tabs for 1 day, then 1 tab for 1 day (Patient not taking: No sig reported)  . [DISCONTINUED] SUMAtriptan (IMITREX) 100 MG tablet Take 100 mg by mouth See admin instructions. 1 tablet by mouth at onset of headache. May repeat in 2 hours, if needed.   No facility-administered encounter medications on file as of 08/26/2020.  :   Review of Systems:  Out of a complete 14 point review of systems, all are reviewed and negative with the exception of these symptoms as listed below:  Review of Systems  Neurological:       Here for migraine f/u. Reports 3-4 per week and will come on suddenly. Reports she typical will take ibuprofen and has been prescribed topmamx.      Objective:  Neurological Exam  Physical Exam Physical Examination:   Vitals:   08/26/20 0839  BP: 120/87  Pulse: 74    General Examination: The patient is a very pleasant 40 y.o. female in no acute distress. She appears well-developed and  well-nourished and well groomed.  No photophobia or sonophobia currently.  HEENT: Normocephalic, atraumatic, pupils are equal, round and reactive to light and accommodation. Extraocular tracking is good without limitation to gaze excursion or nystagmus noted. Normal smooth pursuit is noted. Hearing is grossly intact. Face is symmetric with normal facial animation and normal facial sensation. Speech is clear with no dysarthria noted. There is no hypophonia. There is no lip, neck/head, jaw or voice tremor. Neck is supple with full range of passive and active motion. There are no carotid bruits on auscultation. Oropharynx exam reveals: mild mouth dryness, good dental hygiene and moderate airway crowding, due to tonsils of about 2+, Mallampati class II.  Tongue protrudes centrally and palate elevates symmetrically.  She has a moderate overbite.  Chest: Clear to auscultation without wheezing, rhonchi or crackles noted.  Heart: S1+S2+0, regular and normal without murmurs, rubs or  gallops noted.   Abdomen: Soft, non-tender and non-distended with normal bowel sounds appreciated on auscultation.  Extremities: There is no pitting edema in the distal lower extremities bilaterally. Pedal pulses are intact.  Skin: Warm and dry without trophic changes noted. There are no varicose veins.  Musculoskeletal: exam reveals no obvious joint deformities, tenderness or joint swelling or erythema.   Neurologically:  Mental status: The patient is awake, alert and oriented in all 4 spheres. Her immediate and remote memory, attention, language skills and fund of knowledge are appropriate. There is no evidence of aphasia, agnosia, apraxia or anomia. Speech is clear with normal prosody and enunciation. Thought process is linear. Mood is normal and affect is normal.  Cranial nerves II - XII are as described above under HEENT exam. In addition: shoulder shrug is normal with equal shoulder height noted. Motor exam: Normal bulk,  strength and tone is noted. There is no drift, tremor or rebound. Reflexes are 1+ throughout. Babinski: Toes are flexor bilaterally. Fine motor skills and coordination: intact with normal finger taps, normal hand movements, normal rapid alternating patting, normal foot taps and normal foot agility.  Cerebellar testing: No dysmetria or intention tremor on finger to nose testing. Heel to shin is unremarkable bilaterally. There is no truncal or gait ataxia.  Sensory exam: intact to light touch, vibration, temperature sense in the upper and lower extremities.  Gait, station and balance: She stands easily. No veering to one side is noted. No leaning to one side is noted. Posture is age-appropriate and stance is narrow based. Gait shows normal stride length and normal pace. No problems turning are noted.   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  She had a benign brain MRI in February 2020.  She has a nonfocal neurological exam thankfully.  She is reassured in that regard.  She is advised of headache triggers including sleep deprivation, dehydration stress etc.  She is advised about preventative treatment approach and abortive treatment options.  She tried Imitrex generic which has not helped.  She has been on Topamax 50 mg as needed.  She is advised to take this daily as a preventative and we can gently titrate this up.  She was given written instructions for this and is agreeable to this approach.  Instead of sumatriptan hand I would like for her to try Maxalt.  We also may be able to try one of the newer kinds of abortive medications for migraines in the near future such as Nurtec  or Ubrelvy.  She is advised to proceed with a sleep study to rule out underlying obstructive sleep apnea as a possible contributor to her migraine headaches and recurrent morning headaches.  I talked to her about sleep apnea, its prognosis and treatment options.  She was advised regarding the use of a positive airway pressure device and is agreeable to pursuing a sleep test and considering CPAP or AutoPap therapy if she is a candidate.  We will plan a follow-up after her sleep test.  She was given a new prescription for Topamax, and Maxalt.  In addition, for her nausea, I suggested as needed use of Zofran 4 mg strength.  I answered all her questions today and she was  in agreement.   Thank you very much for allowing me to participate in the care of this nice patient. If I can be of any further assistance to you please do not hesitate to call me at [phone].  Sincerely,   Star Age, MD, PhD

## 2020-08-26 NOTE — Patient Instructions (Addendum)
It was nice to meet you today.  Your neurological exam is normal.  You also had a normal brain MRI through [facility] on 05/29/2018.  I do not believe we have to repeat your brain MRI at this time.  Please remember, common headache triggers are: sleep deprivation, dehydration, overheating, stress, hypoglycemia or skipping meals and blood sugar fluctuations, excessive pain medications or excessive alcohol use or caffeine withdrawal. Some people have food triggers such as aged cheese, orange juice or chocolate, especially dark chocolate, or MSG (monosodium glutamate). Try to avoid these headache triggers as much possible. It may be helpful to keep a headache diary to figure out what makes your headaches worse or brings them on and what alleviates them. Some people report headache onset after exercise but studies have shown that regular exercise may actually prevent headaches from coming. If you have exercise-induced headaches, please make sure that you drink plenty of fluid before and after exercising and that you do not over do it and do not overheat.  For migraine prevention, I would like to suggest: Topamax 50 mg: Take 1-1/2 pills at bedtime daily for one week, then 2 pills at bedtime for 1 week, then 3 pills at bedtime daily thereafter. Common side effects reported are: Sedation, sleepiness, tingling, change in taste especially with carbonated drinks, and rare side effects are glaucoma, kidney stones, and problems with thinking including word finding difficulties.   Based on your symptoms and your exam I believe you are at risk for obstructive sleep apnea (aka OSA), and I think we should proceed with a sleep study to determine whether you do or do not have OSA and how severe it is. Even, if you have mild OSA, I may want you to consider treatment with CPAP, as treatment of even borderline or mild sleep apnea can result and improvement of symptoms such as sleep disruption, daytime sleepiness, nighttime  bathroom breaks, restless leg symptoms, improvement of headache syndromes, even improved mood disorder.   As explained, an attended sleep study meaning you get to stay overnight in the sleep lab, lets [place] monitor sleep-related behaviors such as sleep talking and leg movements in sleep, in addition to monitoring for sleep apnea.  A home sleep test is a screening tool for sleep apnea only, and unfortunately does not help with any other sleep-related diagnoses.  Please remember, the long-term risks and ramifications of untreated moderate to severe obstructive sleep apnea are: increased Cardiovascular disease, including congestive heart failure, stroke, difficult to control hypertension, treatment resistant obesity, arrhythmias, especially irregular heartbeat commonly known as A. Fib. (atrial fibrillation); even type 2 diabetes has been linked to untreated OSA.   Sleep apnea can cause disruption of sleep and sleep deprivation in most cases, which, in turn, can cause recurrent headaches, problems with memory, mood, concentration, focus, and vigilance. Most people with untreated sleep apnea report excessive daytime sleepiness, which can affect their ability to drive. Please do not drive if you feel sleepy. Patients with sleep apnea can also develop difficulty initiating and maintaining sleep (aka insomnia).   For migraine acute treatment, we will try Maxalt orally disintegrating tab, 10 mg: take 1 pill early on when you suspect a migraine attack come on. You may take another pill within 2 hours, no more than 2 pills in 24 hours.   You can stop taking the Imitrex (sumatriptan).   Most people who take triptans do not have any serious side-effects. However, they can cause drowsiness (remember to not drive or use  heavy machinery when drowsy), nausea, dizziness, dry mouth. Less common side effects include strange sensations, such as tightness in your chest or throat, tingling, flushing, and feelings of heaviness or  pressure in areas such as the face, limbs, and chest. These in the chest can mimic heart related pain (angina) and may cause alarm, but usually these sensations are not harmful or a sign of a heart attack. However, if you develop intense chest pain or sensations of discomfort, you should stop taking your medication and consult with me or your PCP or go to the nearest urgent care facility or ER or call 911.    For nausea, you can try the ondansetron 4 mg as needed.  I have provided a new prescription for this.  We can try other medications if need be in the near future.  If you have sleep apnea, you may benefit from using a machine called CPAP or AutoPap like I showed you in my exam room.

## 2020-08-27 LAB — URINE CULTURE
MICRO NUMBER:: 11923350
SPECIMEN QUALITY:: ADEQUATE

## 2020-10-13 ENCOUNTER — Ambulatory Visit: Payer: Medicaid Other | Admitting: Neurology

## 2020-10-13 DIAGNOSIS — R519 Headache, unspecified: Secondary | ICD-10-CM

## 2020-10-13 DIAGNOSIS — E663 Overweight: Secondary | ICD-10-CM

## 2020-10-13 DIAGNOSIS — G4733 Obstructive sleep apnea (adult) (pediatric): Secondary | ICD-10-CM

## 2020-10-13 DIAGNOSIS — G43019 Migraine without aura, intractable, without status migrainosus: Secondary | ICD-10-CM | POA: Diagnosis not present

## 2020-10-13 DIAGNOSIS — R0683 Snoring: Secondary | ICD-10-CM

## 2020-10-23 NOTE — Progress Notes (Signed)
See procedure note.

## 2020-10-27 NOTE — Addendum Note (Signed)
Addended by: Huston Foley on: 10/27/2020 05:24 PM   Modules accepted: Orders

## 2020-10-27 NOTE — Procedures (Signed)
   [facility]  HOME SLEEP TEST (Watch PAT) REPORT  STUDY DATE: 10/14/20  DOB: 1980/06/02  MRN: [id number]  ORDERING CLINICIAN: Huston Foley, MD, PhD   REFERRING CLINICIAN: Dr. Margo Aye  CLINICAL INFORMATION/HISTORY: 40 year old right-handed woman with an underlying medical history of cholecystitis, tinnitus, allergies, and overweight state, who reports recurrent headaches since she was in elementary school. She snores per her husband's feedback and does not typically get 7 or 8 hours of sleep.   BMI: 29 kg/m  FINDINGS:   Sleep Summary:   Total Recording Time (hours, min): 6 hours, 50 minutes  Total Sleep Time (hours, min): 6 hours, 12 minutes   Percent REM (%):    32.1%   Respiratory Indices:   Calculated pAHI (per hour):  45/hour         REM pAHI:    61.9/hour       NREM pAHI: 37/hour  Oxygen Saturation Statistics:    Oxygen Saturation (%) Mean: 97%   Minimum oxygen saturation (%):                 85%   O2 Saturation Range (%): 85-100%    O2 Saturation (minutes) <=88%: 0.1 min  Pulse Rate Statistics:   Pulse Mean (bpm):    66/min    Pulse Range (43-110/min)   IMPRESSION: OSA (obstructive sleep apnea)  RECOMMENDATION:  This home sleep test demonstrates severe obstructive sleep apnea with a total AHI of 45/hour and O2 nadir of 85%.  Mild to moderate snoring was detected.  Treatment with positive airway pressure is recommended. The patient will be advised to proceed with an autoPAP titration/trial at home for now. A full night titration study may be considered to optimize treatment settings, if needed down the road. Please note that untreated obstructive sleep apnea may carry additional perioperative morbidity. Patients with significant obstructive sleep apnea should receive perioperative PAP therapy and the surgeons and particularly the anesthesiologist should be informed of the diagnosis and the severity of the sleep disordered  breathing. The patient should be cautioned not to drive, work at heights, or operate dangerous or heavy equipment when tired or sleepy. Review and reiteration of good sleep hygiene measures should be pursued with any patient. Other causes of the patient's symptoms, including circadian rhythm disturbances, an underlying mood disorder, medication effect and/or an underlying medical problem cannot be ruled out based on this test. Clinical correlation is recommended. The patient and her referring provider will be notified of the test results. The patient will be seen in follow up in sleep clinic at [facility].  I certify that I have reviewed the raw data recording prior to the issuance of this report in accordance with the standards of the American Academy of Sleep Medicine (AASM).  INTERPRETING PHYSICIAN:   Huston Foley, MD, PhD  Board Certified in Neurology and Sleep Medicine  [facility] [address], [address] [place], [place] [address] [phone]

## 2020-10-29 ENCOUNTER — Telehealth: Payer: Self-pay | Admitting: *Deleted

## 2020-10-29 NOTE — Telephone Encounter (Signed)
-----   Message from Saima Athar, MD sent at 10/27/2020  5:24 PM EDT ----- Interpreter needed. Patient referred by Dr. Rosen, seen by me on 08/26/2020 for recurrent headaches, patient had a HST on 10/14/2020.    Please call and notify the patient that the recent home sleep test showed obstructive sleep apnea in the severe range. I recommend treatment in the form of autoPAP, which means, that we don't have to bring her in for a sleep study with CPAP, but will let her start using a so called autoPAP machine at home, which is a CPAP-like machine with self-adjusting pressures. We will send the order to a local DME company (of her choice, or as per insurance requirement). The DME representative will fit her with a mask, educate her on how to use the machine, how to put the mask on, etc. I have placed an order in the chart. Please send the order, talk to patient, send report to referring MD. We will need a FU in sleep clinic for 10 weeks post-PAP set up, please arrange that with me or one of our NPs. Also reinforce the need for compliance with treatment. Thanks,   Saima Athar, MD, PhD [facility] ([facility])     

## 2020-10-29 NOTE — Telephone Encounter (Signed)
Called and LMVM by Hib[place]ullah [phone] to have someone return call for sleep study results.

## 2020-11-04 ENCOUNTER — Encounter: Payer: Self-pay | Admitting: *Deleted

## 2020-11-04 ENCOUNTER — Telehealth: Payer: Self-pay | Admitting: *Deleted

## 2020-11-04 NOTE — Telephone Encounter (Signed)
I called with interpreter Shirline Frees [phone], husband picked up but could not talk told to call his wife at [phone] which we called but could not LMVM (as not set up or full).  Will send letter as this is 2nd time have called.  Mailed letter.  I made f.u appt 12-25-20 for intial autopap f/u.  I did not cancel the 11-24-20 as was not able to reach and they may have questions (giving results at that appt).

## 2020-11-04 NOTE — Telephone Encounter (Signed)
-----   Message from Huston Foley, MD sent at 10/27/2020  5:24 PM EDT ----- Interpreter needed. Patient referred by Dr. Pollyann Kennedy, seen by me on 08/26/2020 for recurrent headaches, patient had a HST on 10/14/2020.    Please call and notify the patient that the recent home sleep test showed obstructive sleep apnea in the severe range. I recommend treatment in the form of autoPAP, which means, that we don't have to bring her in for a sleep study with CPAP, but will let her start using a so called autoPAP machine at home, which is a CPAP-like machine with self-adjusting pressures. We will send the order to a local DME company (of her choice, or as per insurance requirement). The DME representative will fit her with a mask, educate her on how to use the machine, how to put the mask on, etc. I have placed an order in the chart. Please send the order, talk to patient, send report to referring MD. We will need a FU in sleep clinic for 10 weeks post-PAP set up, please arrange that with me or one of our NPs. Also reinforce the need for compliance with treatment. Thanks,   Huston Foley, MD, PhD [facility])

## 2020-11-06 ENCOUNTER — Encounter: Payer: Self-pay | Admitting: *Deleted

## 2020-11-06 NOTE — Telephone Encounter (Signed)
Pt is asking for a call back from RN to discuss results to sleep test, pt is also given [phone] as a # to call if not available on primary # listed

## 2020-11-06 NOTE — Telephone Encounter (Signed)
Mailed letter to pt

## 2020-11-06 NOTE — Telephone Encounter (Signed)
I called pt , interpreter, [facility] [phone] (arabic) relayed the sleep study results. I called pt. I advised pt that Dr. Frances Furbish reviewed their sleep study results and found that pt has severe OSA.. Dr. Frances Furbish recommends that pt start autopap. I reviewed PAP compliance expectations with the pt. Pt is agreeable to starting an auto-PAP. I advised pt that an order will be sent to a DME, [facility], and [facility] will call the pt within about one week after they file with the pt's insurance. [facility] will show the pt how to use the machine, fit for masks, and troubleshoot the auto-PAP if needed. A follow up appt will be made for insurance purposes with Dr. Frances Furbish or NP in 10 weeks after starting autopap. Pt will call to schedule when she gets machine.  Made aware of shipping delays.  A letter with all of this information in it will be mailed to the pt as a reminder. I verified with the pt that the address we have on file is correct. Pt verbalized understanding of results. Pt had no questions at this time but was encouraged to call back if questions arise. I have sent the order to [facility] and have received confirmation that they have received the order.

## 2020-11-11 NOTE — Telephone Encounter (Signed)
Ross Ludwig, RN got it!   Thanks!       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     Thank you Delmer Islam  .

## 2020-11-24 ENCOUNTER — Ambulatory Visit: Payer: Medicaid Other | Admitting: Neurology

## 2020-12-02 ENCOUNTER — Other Ambulatory Visit: Payer: Self-pay | Admitting: Internal Medicine

## 2020-12-03 LAB — CBC
HCT: 39.7 % (ref 35.0–45.0)
Hemoglobin: 12.5 g/dL (ref 11.7–15.5)
MCH: 26.5 pg — ABNORMAL LOW (ref 27.0–33.0)
MCHC: 31.5 g/dL — ABNORMAL LOW (ref 32.0–36.0)
MCV: 84.1 fL (ref 80.0–100.0)
MPV: 9.9 fL (ref 7.5–12.5)
Platelets: 371 10*3/uL (ref 140–400)
RBC: 4.72 10*6/uL (ref 3.80–5.10)
RDW: 12.7 % (ref 11.0–15.0)
WBC: 5 10*3/uL (ref 3.8–10.8)

## 2020-12-03 LAB — COMPLETE METABOLIC PANEL WITH GFR
AG Ratio: 1.2 (calc) (ref 1.0–2.5)
ALT: 12 U/L (ref 6–29)
AST: 14 U/L (ref 10–30)
Albumin: 4.2 g/dL (ref 3.6–5.1)
Alkaline phosphatase (APISO): 71 U/L (ref 31–125)
BUN: 12 mg/dL (ref 7–25)
CO2: 21 mmol/L (ref 20–32)
Calcium: 9.4 mg/dL (ref 8.6–10.2)
Chloride: 102 mmol/L (ref 98–110)
Creat: 0.64 mg/dL (ref 0.50–0.97)
Globulin: 3.5 g/dL (calc) (ref 1.9–3.7)
Glucose, Bld: 90 mg/dL (ref 65–99)
Potassium: 3.7 mmol/L (ref 3.5–5.3)
Sodium: 136 mmol/L (ref 135–146)
Total Bilirubin: 0.3 mg/dL (ref 0.2–1.2)
Total Protein: 7.7 g/dL (ref 6.1–8.1)
eGFR: 116 mL/min/{1.73_m2} (ref >=60)

## 2020-12-03 LAB — LIPID PANEL
Cholesterol: 182 mg/dL (ref <200)
HDL: 68 mg/dL (ref >=50)
LDL Cholesterol (Calc): 98 mg/dL (calc)
Non-HDL Cholesterol (Calc): 114 mg/dL (calc) (ref <130)
Total CHOL/HDL Ratio: 2.7 (calc) (ref <5.0)
Triglycerides: 70 mg/dL (ref <150)

## 2020-12-03 LAB — VITAMIN D 25 HYDROXY (VIT D DEFICIENCY, FRACTURES): Vit D, 25-Hydroxy: 29 ng/mL — ABNORMAL LOW (ref 30–100)

## 2020-12-03 LAB — TSH: TSH: 1.49 mIU/L

## 2020-12-25 ENCOUNTER — Ambulatory Visit: Payer: Self-pay | Admitting: Neurology

## 2021-01-22 ENCOUNTER — Ambulatory Visit: Payer: Self-pay | Admitting: *Deleted

## 2021-01-22 ENCOUNTER — Observation Stay (HOSPITAL_COMMUNITY)
Admission: AD | Admit: 2021-01-22 | Discharge: 2021-01-24 | Disposition: A | Discharge status: Home / Self Care | Payer: Medicaid Other | Attending: Internal Medicine | Admitting: Internal Medicine

## 2021-01-22 ENCOUNTER — Encounter (HOSPITAL_COMMUNITY): Payer: Self-pay | Admitting: Obstetrics and Gynecology

## 2021-01-22 ENCOUNTER — Other Ambulatory Visit: Payer: Self-pay

## 2021-01-22 ENCOUNTER — Inpatient Hospital Stay (HOSPITAL_COMMUNITY): Payer: Medicaid Other

## 2021-01-22 DIAGNOSIS — O219 Vomiting of pregnancy, unspecified: Secondary | ICD-10-CM | POA: Insufficient documentation

## 2021-01-22 DIAGNOSIS — G43019 Migraine without aura, intractable, without status migrainosus: Secondary | ICD-10-CM

## 2021-01-22 DIAGNOSIS — O99891 Other specified diseases and conditions complicating pregnancy: Secondary | ICD-10-CM | POA: Diagnosis present

## 2021-01-22 DIAGNOSIS — O09521 Supervision of elderly multigravida, first trimester: Secondary | ICD-10-CM | POA: Diagnosis not present

## 2021-01-22 DIAGNOSIS — R1013 Epigastric pain: Secondary | ICD-10-CM

## 2021-01-22 DIAGNOSIS — E663 Overweight: Secondary | ICD-10-CM

## 2021-01-22 DIAGNOSIS — Z3A10 10 weeks gestation of pregnancy: Secondary | ICD-10-CM | POA: Diagnosis not present

## 2021-01-22 DIAGNOSIS — K8020 Calculus of gallbladder without cholecystitis without obstruction: Secondary | ICD-10-CM | POA: Diagnosis not present

## 2021-01-22 DIAGNOSIS — Z3490 Encounter for supervision of normal pregnancy, unspecified, unspecified trimester: Secondary | ICD-10-CM

## 2021-01-22 DIAGNOSIS — E871 Hypo-osmolality and hyponatremia: Secondary | ICD-10-CM | POA: Diagnosis present

## 2021-01-22 DIAGNOSIS — Z20822 Contact with and (suspected) exposure to covid-19: Secondary | ICD-10-CM | POA: Diagnosis not present

## 2021-01-22 DIAGNOSIS — E876 Hypokalemia: Secondary | ICD-10-CM | POA: Diagnosis present

## 2021-01-22 DIAGNOSIS — R8271 Bacteriuria: Secondary | ICD-10-CM | POA: Diagnosis present

## 2021-01-22 DIAGNOSIS — R519 Headache, unspecified: Secondary | ICD-10-CM

## 2021-01-22 DIAGNOSIS — K8051 Calculus of bile duct without cholangitis or cholecystitis with obstruction: Secondary | ICD-10-CM | POA: Diagnosis present

## 2021-01-22 DIAGNOSIS — R0683 Snoring: Secondary | ICD-10-CM

## 2021-01-22 DIAGNOSIS — O26611 Liver and biliary tract disorders in pregnancy, first trimester: Secondary | ICD-10-CM | POA: Diagnosis not present

## 2021-01-22 DIAGNOSIS — O26891 Other specified pregnancy related conditions, first trimester: Secondary | ICD-10-CM

## 2021-01-22 DIAGNOSIS — R7401 Elevation of levels of liver transaminase levels: Secondary | ICD-10-CM

## 2021-01-22 LAB — AMYLASE: Amylase: 89 U/L (ref 28–100)

## 2021-01-22 LAB — COMPREHENSIVE METABOLIC PANEL
ALT: 654 U/L — ABNORMAL HIGH (ref 0–44)
AST: 488 U/L — ABNORMAL HIGH (ref 15–41)
Albumin: 3.7 g/dL (ref 3.5–5.0)
Alkaline Phosphatase: 194 U/L — ABNORMAL HIGH (ref 38–126)
Anion gap: 9 (ref 5–15)
BUN: 8 mg/dL (ref 6–20)
CO2: 25 mmol/L (ref 22–32)
Calcium: 9.3 mg/dL (ref 8.9–10.3)
Chloride: 98 mmol/L (ref 98–111)
Creatinine, Ser: 0.67 mg/dL (ref 0.44–1.00)
GFR, Estimated: >60 mL/min (ref >60)
Glucose, Bld: 104 mg/dL — ABNORMAL HIGH (ref 70–99)
Potassium: 3.4 mmol/L — ABNORMAL LOW (ref 3.5–5.1)
Sodium: 132 mmol/L — ABNORMAL LOW (ref 135–145)
Total Bilirubin: 1.3 mg/dL — ABNORMAL HIGH (ref 0.3–1.2)
Total Protein: 8 g/dL (ref 6.5–8.1)

## 2021-01-22 LAB — CBC
HCT: 38.6 % (ref 36.0–46.0)
Hemoglobin: 12.7 g/dL (ref 12.0–15.0)
MCH: 26.7 pg (ref 26.0–34.0)
MCHC: 32.9 g/dL (ref 30.0–36.0)
MCV: 81.3 fL (ref 80.0–100.0)
Platelets: 307 10*3/uL (ref 150–400)
RBC: 4.75 MIL/uL (ref 3.87–5.11)
RDW: 13.3 % (ref 11.5–15.5)
WBC: 6.5 10*3/uL (ref 4.0–10.5)
nRBC: 0 % (ref 0.0–0.2)

## 2021-01-22 LAB — URINALYSIS, ROUTINE W REFLEX MICROSCOPIC
Bilirubin Urine: NEGATIVE
Glucose, UA: NEGATIVE mg/dL
Ketones, ur: NEGATIVE mg/dL
Nitrite: NEGATIVE
Protein, ur: 30 mg/dL — AB
Specific Gravity, Urine: 1.024 (ref 1.005–1.030)
WBC, UA: >50 WBC/hpf — ABNORMAL HIGH (ref 0–5)
pH: 5 (ref 5.0–8.0)

## 2021-01-22 LAB — PROTIME-INR
INR: 1.1 (ref 0.8–1.2)
Prothrombin Time: 13.9 seconds (ref 11.4–15.2)

## 2021-01-22 LAB — LIPASE, BLOOD: Lipase: 64 U/L — ABNORMAL HIGH (ref 11–51)

## 2021-01-22 LAB — POCT PREGNANCY, URINE: Preg Test, Ur: POSITIVE — AB

## 2021-01-22 LAB — APTT: aPTT: 26 seconds (ref 24–36)

## 2021-01-22 MED ORDER — FAMOTIDINE IN NACL 20-0.9 MG/50ML-% IV SOLN
20.0000 mg | Freq: Once | INTRAVENOUS | Status: AC
  Administered 2021-01-22: 20 mg via INTRAVENOUS
  Filled 2021-01-22: qty 50

## 2021-01-22 MED ORDER — SODIUM CHLORIDE 0.9 % IV SOLN
2.0000 g | INTRAVENOUS | Status: DC
  Administered 2021-01-23 (×2): 2 g via INTRAVENOUS
  Filled 2021-01-22 (×2): qty 20

## 2021-01-22 MED ORDER — ONDANSETRON HCL 4 MG/2ML IJ SOLN: 4.0000 mg | Freq: Four times a day (QID) | INTRAMUSCULAR | Status: DC | PRN

## 2021-01-22 MED ORDER — ONDANSETRON 4 MG PO TBDP
8.0000 mg | ORAL_TABLET | Freq: Once | ORAL | Status: AC
  Administered 2021-01-22: 8 mg via ORAL
  Filled 2021-01-22: qty 2

## 2021-01-22 MED ORDER — POTASSIUM CHLORIDE IN NACL 40-0.9 MEQ/L-% IV SOLN
INTRAVENOUS | Status: AC
  Filled 2021-01-22: qty 1000

## 2021-01-22 MED ORDER — LACTATED RINGERS IV BOLUS
1000.0000 mL | Freq: Once | INTRAVENOUS | Status: AC
  Administered 2021-01-22: 1000 mL via INTRAVENOUS

## 2021-01-22 NOTE — MAU Note (Addendum)
Presents c/o N/V, reports hasn't kept anything down since Monday including water. Currently not prescribed any meds. LMP 11/12/2020.  Reports +HPT.

## 2021-01-22 NOTE — MAU Provider Note (Signed)
Chief Complaint:  Nausea and Emesis   Event Date/Time   First Provider Initiated Contact with Patient 01/22/21 1920     HPI: Kristen Shaw is a 40 y.o. G5P4004 at [redacted]w[redacted]d who presents to maternity admissions reporting severe nausea/vomiting with epigastric pain after vomiting. She has been unable to keep anything down since Monday, continues to try drinking water but often throws it up. Has a history of gallbladder disease but does not feel this is a gallbladder attack. Per pt, it was recommended she have her gallbladder removed but then she found out about the pregnancy so never made another appointment. Denies vaginal bleeding, fever, falls, or recent illness.   Pregnancy Course: Uncomplicated so far, receives care at [facility].  Past Medical History:  Diagnosis Date   Migraines    OB History  Gravida Para Term Preterm AB Living  5 4 4     4   SAB IAB Ectopic Multiple Live Births          4    # Outcome Date GA Lbr Len/2nd Weight Sex Delivery Anes PTL Lv  5 Current           4 Term 01/03/13 [redacted]w[redacted]d 20:00 / 00:34 7 lb 9.2 oz (3.436 kg) F Vag-Spont Local  LIV     Birth Comments: Girl Kristen Shaw is a 7 lb 9.2 oz (3436 g) female infant born at Gestational Age: [redacted]w[redacted]d.   Prenatal & Delivery Information Mother, Kristen Shaw , is a 78 y.o.  [phone] .   Prenatal labs ABO/Rh A/POS/-- (04/09 1035)   Antibody NEG (04/09 1035)   Rubella 6.78 (04/09 1035)   RPR NON REACTIVE (10/01 1321)   HBsAG NEGATIVE (04/09 1035)   HIV NON REACTIVE (09/10 1128)   GBS Negative (09/10 0000)       Prenatal care: late at 15 wks.   Pregnancy complications: Breech s/p version   Delivery complications: None Date & time of delivery: 01/03/2013, 9:34 PM Route of delivery: Vaginal, Spontaneous Delivery. Apgar scores: 9 at 1 minute, 9 at 5 minutes. ROM: 01/03/2013, 12:25 Pm, Spontaneous, Light Meconium.  9 hours prior to delivery Maternal antibiotics: None     Nursery Course past 24  hours:  Doing well. VSS, breast feeding X 9  (LATCH 10), 4 voids and  and 4 stools.  Family ready for discharge today, discharge instructions done using Arabic interpreter via phone, family had no concerns.     Screening Tests, Labs & Immunizations: Infant Blood Type:  Not indicated   Infant DAT:  Not indicated   HepB vaccine: Given on 01/03/2013 Newborn screen: DRAWN BY RN  (10/02 2345) Hearing Screen: Right Ear: Pass (10/03 0425)           Left Ear: Pass (10/03 0425) Transcutaneous bilirubin: 4.9 /25 hours (10/02 2318), risk zoneLow. Risk factors for jaundice:None Congenital Heart Screening:    Age at Inititial Screening: 25 hours Initial Screening Pulse 02 saturation of RIGHT hand: 100 % Pulse 02 saturation of Foot: 100 % Difference (right hand - foot): 0 % Pass / Fail: Pass      Feeding: Formula Feed for Exclusion:   No     3 Term 09/12/09 [redacted]w[redacted]d   M Vag-Spont None N LIV  2 Term 09/05/07 [redacted]w[redacted]d   F Vag-Spont None N LIV  1 Term 12/02/04 [redacted]w[redacted]d   F Vag-Spont None N LIV   History reviewed. No pertinent surgical history. History reviewed. No pertinent family history. Social History   Tobacco Use  Smoking status: Never   Smokeless tobacco: Never  Substance Use Topics   Alcohol use: No   Drug use: No   Allergies  Allergen Reactions   Banana Other (See Comments)    Stomach upset.   Penicillin G Itching   Tomato Nausea And Vomiting   Medications Prior to Admission  Medication Sig Dispense Refill Last Dose   fluticasone (FLONASE) 50 MCG/ACT nasal spray Place 1 spray into both nostrils daily. (Patient taking differently: Place 1 spray into both nostrils daily as needed for allergies or rhinitis.) 16 g 2    ibuprofen (ADVIL) 800 MG tablet Take 800 mg by mouth 3 (three) times daily.      ondansetron (ZOFRAN) 4 MG tablet Take 1 tablet (4 mg total) by mouth every 8 (eight) hours as needed for nausea or vomiting. 20 tablet 0    topiramate (TOPAMAX) 50 MG tablet Take 3 tablets (150  mg total) by mouth at bedtime. Please follow instructions provided separately in writing 90 tablet 3    I have reviewed patient's Past Medical Hx, Surgical Hx, Family Hx, Social Hx, medications and allergies.   ROS:  Pertinent items noted in HPI and remainder of comprehensive ROS otherwise negative.   Physical Exam  Patient Vitals for the past 24 hrs:  BP Temp Temp src Pulse Resp SpO2 Height Weight  01/22/21 1902 126/81 -- -- 80 -- -- -- --  01/22/21 1800 119/79 98.4 F (36.9 C) Oral 75 19 100 % -- --  01/22/21 1750 -- -- -- -- -- -- 5\' 6"  (1.676 m) 173 lb 8 oz (78.7 kg)   Constitutional: Well-developed, well-nourished female in no acute distress.  Cardiovascular: normal rate & rhythm Respiratory: normal effort GI: Abd soft, non-tender, gravid appropriate for gestational age MS: Extremities nontender, no edema, normal ROM Neurologic: Alert and oriented x 4.  GU: no CVA tenderness Pelvic: exam deferred  Labs: Results for orders placed or performed during the hospital encounter of 01/22/21 (from the past 24 hour(s))  Pregnancy, urine POC     Status: Abnormal   Collection Time: 01/22/21  5:42 PM  Result Value Ref Range   Preg Test, Ur POSITIVE (A) NEGATIVE  Urinalysis, Routine w reflex microscopic Urine, Clean Catch     Status: Abnormal   Collection Time: 01/22/21  6:00 PM  Result Value Ref Range   Color, Urine AMBER (A) YELLOW   APPearance CLOUDY (A) CLEAR   Specific Gravity, Urine 1.024 1.005 - 1.030   pH 5.0 5.0 - 8.0   Glucose, UA NEGATIVE NEGATIVE mg/dL   Hgb urine dipstick SMALL (A) NEGATIVE   Bilirubin Urine NEGATIVE NEGATIVE   Ketones, ur NEGATIVE NEGATIVE mg/dL   Protein, ur 30 (A) NEGATIVE mg/dL   Nitrite NEGATIVE NEGATIVE   Leukocytes,Ua MODERATE (A) NEGATIVE   RBC / HPF 6-10 0 - 5 RBC/hpf   WBC, UA >50 (H) 0 - 5 WBC/hpf   Bacteria, UA FEW (A) NONE SEEN   Squamous Epithelial / LPF 21-50 0 - 5   WBC Clumps PRESENT    Mucus PRESENT   CBC     Status: None    Collection Time: 01/22/21  7:16 PM  Result Value Ref Range   WBC 6.5 4.0 - 10.5 K/uL   RBC 4.75 3.87 - 5.11 MIL/uL   Hemoglobin 12.7 12.0 - 15.0 g/dL   HCT 01/24/21 23.5 - 57.3 %   MCV 81.3 80.0 - 100.0 fL   MCH 26.7 26.0 - 34.0 pg  MCHC 32.9 30.0 - 36.0 g/dL   RDW 69.6 29.5 - 28.4 %   Platelets 307 150 - 400 K/uL   nRBC 0.0 0.0 - 0.2 %  Comprehensive metabolic panel     Status: Abnormal   Collection Time: 01/22/21  7:16 PM  Result Value Ref Range   Sodium 132 (L) 135 - 145 mmol/L   Potassium 3.4 (L) 3.5 - 5.1 mmol/L   Chloride 98 98 - 111 mmol/L   CO2 25 22 - 32 mmol/L   Glucose, Bld 104 (H) 70 - 99 mg/dL   BUN 8 6 - 20 mg/dL   Creatinine, Ser 1.32 0.44 - 1.00 mg/dL   Calcium 9.3 8.9 - 44.0 mg/dL   Total Protein 8.0 6.5 - 8.1 g/dL   Albumin 3.7 3.5 - 5.0 g/dL   AST 102 (H) 15 - 41 U/L   ALT 654 (H) 0 - 44 U/L   Alkaline Phosphatase 194 (H) 38 - 126 U/L   Total Bilirubin 1.3 (H) 0.3 - 1.2 mg/dL   GFR, Estimated >72 >53 mL/min   Anion gap 9 5 - 15  Lipase, blood     Status: Abnormal   Collection Time: 01/22/21  7:16 PM  Result Value Ref Range   Lipase 64 (H) 11 - 51 U/L  Amylase     Status: None   Collection Time: 01/22/21  8:59 PM  Result Value Ref Range   Amylase 89 28 - 100 U/L    Imaging:  US Abdomen Complete  Result Date: 01/22/2021 CLINICAL DATA:  Nausea, vomiting, epigastric pain EXAM: ABDOMEN ULTRASOUND COMPLETE COMPARISON:  08/10/2019 FINDINGS: Gallbladder: Multiple layering gallstones measuring up to 11 mm. No wall thickening or sonographic Murphy sign. Common bile duct: Diameter: Dilated measuring 8 mm. Common bile duct remains dilated in the region of the pancreatic head. No visible stones. Liver: No focal lesion identified. Within normal limits in parenchymal echogenicity. Portal vein is patent on color Doppler imaging with normal direction of blood flow towards the liver. IVC: No abnormality visualized. Pancreas: Visualized portion unremarkable. Spleen: Size and  appearance within normal limits. Right Kidney: Length: 12.3 cm. Echogenicity within normal limits. No mass or hydronephrosis visualized. Left Kidney: Length: 10.5 cm.  Cyst Abdominal aorta: No aneurysm visualized. Other findings: None. IMPRESSION: Cholelithiasis. Common bile duct is dilated measuring 8 mm. The duct is dilated into the pancreatic head. Cannot exclude distal obstructing stone. Consider further evaluation with MRCP or ERCP. Electronically Signed   By: Charlett Nose M.D.   On: 01/22/2021 21:52     MAU Course: Orders Placed This Encounter  Procedures   Urinalysis, Routine w reflex microscopic Urine, Clean Catch   CBC   Comprehensive metabolic panel   Lipase, blood   Pregnancy, urine POC   Meds ordered this encounter  Medications   lactated ringers bolus 1,000 mL   famotidine (PEPCID) IVPB 20 mg premix   ondansetron (ZOFRAN-ODT) disintegrating tablet 8 mg   MDM: Lr bolus plus zofran and pepcid IVPB ordered for nausea and epigastric pain Ordered CBC, CMP, lipase due to history of gallbladder disease  Meds and fluids provided good relief of symptoms. Labs showed elevated liver enzymes and lipase. Ordered amylase and upper abdomen ultrasound. Consulted general surgery (Dr. Cliffton Asters) who recommended admission to medicine if U/S shows gallstones. Updated OB attending (Dr. Donavan Foil) and handed off care to Wynelle Bourgeois, CNM at 2115. Edd Arbour, CNM, MSN, IBCLC  [place] results reviewed with Dr Donavan Foil +cholelithiasis with dilation of CBD Possible  obstructing stone  Per Dr Cliffton Asters, admit to Medicine and they will follow up. Consulted Dr Shauna Hugh who will arrange the admission and then they will contact surgery if necessary.    Assessment: Single IUP at.[redacted]w[redacted]d Epigastric pain with vomiting Elevated transaminases, mild elevation in Lipase and BIli Cholelithiasis Possible obstruction of CBD  Plan: Admit to Medicine Service Orders per Dr Leafy Half MD to follow Kristen Shaw,  CNM

## 2021-01-22 NOTE — Telephone Encounter (Signed)
Reason for Disposition  [1] Drinking very little AND [2] dehydration suspected (e.g., no urine > 12 hours, very dry mouth, very lightheaded)  Answer Assessment - Initial Assessment Questions 1. VOMITING SEVERITY: "How many times have you vomited in the past 24 hours?"     - MILD:  1 - 2 times/day    - MODERATE: 3 - 5 times/day, decreased oral intake without significant weight loss or symptoms of dehydration    - SEVERE: 6 or more times/day, vomits everything or nearly everything, with significant weight loss, symptoms of dehydration      Moderate/severe 2. ONSET: "When did the vomiting begin?"      Monday 3. FLUIDS: "What fluids or food have you vomited up today?" "Are you able to keep any liquids down?"     Nothing- vomiting everything 4. TREATMENT: "What have you been doing so far to treat this?"      no 5. DEHYDRATION: "When was the last time you urinated?" "Are you feeling lightheaded?" "Weight loss?"     2 hours ago, weakness, weight loss 6. PREGNANCY: "How many weeks pregnant are you?" "How has the pregnancy been going?"     11/12/20 7. EDD: "What date are you expecting to deliver?"      8. MEDICATIONS: "What medications are you taking?" (e.g., prenatal vitamins, iron)     no 9. OTHER SYMPTOMS: "Do you have any other symptoms?"     Pain in abdomen  Protocols used: Pregnancy - Morning Sickness (Nausea and Vomiting of Pregnancy)-A-AH

## 2021-01-22 NOTE — Telephone Encounter (Signed)
Patient is calling to report she is early pregnant and having severe vomiting since Monday. Patient states she is unable to keep solid/fluids down.

## 2021-01-22 NOTE — MAU Provider Note (Addendum)
Chief Complaint:  Nausea and Emesis   Event Date/Time   First Provider Initiated Contact with Patient 01/22/21 1920     HPI: Kristen Shaw is a 40 y.o. G5P4004 at [redacted]w[redacted]d who presents to maternity admissions reporting severe nausea/vomiting with epigastric pain after vomiting. She has been unable to keep anything down since Monday, continues to try drinking water but often throws it up. Has a history of gallbladder disease but does not feel this is a gallbladder attack. Per pt, it was recommended she have her gallbladder removed but then she found out about the pregnancy so never made another appointment. Denies vaginal bleeding, fever, falls, or recent illness.   Pregnancy Course: Uncomplicated so far, receives care at [facility].  Past Medical History:  Diagnosis Date   Migraines    OB History  Gravida Para Term Preterm AB Living  5 4 4     4   SAB IAB Ectopic Multiple Live Births          4    # Outcome Date GA Lbr Len/2nd Weight Sex Delivery Anes PTL Lv  5 Current           4 Term 01/03/13 [redacted]w[redacted]d 20:00 / 00:34 7 lb 9.2 oz (3.436 kg) F Vag-Spont Local  LIV     Birth Comments: Girl Kristen Shaw is a 7 lb 9.2 oz (3436 g) female infant born at Gestational Age: [redacted]w[redacted]d.   Prenatal & Delivery Information Mother, Kristen Shaw , is a 78 y.o.  [phone] .   Prenatal labs ABO/Rh A/POS/-- (04/09 1035)   Antibody NEG (04/09 1035)   Rubella 6.78 (04/09 1035)   RPR NON REACTIVE (10/01 1321)   HBsAG NEGATIVE (04/09 1035)   HIV NON REACTIVE (09/10 1128)   GBS Negative (09/10 0000)       Prenatal care: late at 15 wks.   Pregnancy complications: Breech s/p version   Delivery complications: None Date & time of delivery: 01/03/2013, 9:34 PM Route of delivery: Vaginal, Spontaneous Delivery. Apgar scores: 9 at 1 minute, 9 at 5 minutes. ROM: 01/03/2013, 12:25 Pm, Spontaneous, Light Meconium.  9 hours prior to delivery Maternal antibiotics: None     Nursery Course past 24  hours:  Doing well. VSS, breast feeding X 9  (LATCH 10), 4 voids and  and 4 stools.  Family ready for discharge today, discharge instructions done using Arabic interpreter via phone, family had no concerns.     Screening Tests, Labs & Immunizations: Infant Blood Type:  Not indicated   Infant DAT:  Not indicated   HepB vaccine: Given on 01/03/2013 Newborn screen: DRAWN BY RN  (10/02 2345) Hearing Screen: Right Ear: Pass (10/03 0425)           Left Ear: Pass (10/03 0425) Transcutaneous bilirubin: 4.9 /25 hours (10/02 2318), risk zoneLow. Risk factors for jaundice:None Congenital Heart Screening:    Age at Inititial Screening: 25 hours Initial Screening Pulse 02 saturation of RIGHT hand: 100 % Pulse 02 saturation of Foot: 100 % Difference (right hand - foot): 0 % Pass / Fail: Pass      Feeding: Formula Feed for Exclusion:   No     3 Term 09/12/09 [redacted]w[redacted]d   M Vag-Spont None N LIV  2 Term 09/05/07 [redacted]w[redacted]d   F Vag-Spont None N LIV  1 Term 12/02/04 [redacted]w[redacted]d   F Vag-Spont None N LIV   History reviewed. No pertinent surgical history. History reviewed. No pertinent family history. Social History   Tobacco Use  Smoking status: Never   Smokeless tobacco: Never  Substance Use Topics   Alcohol use: No   Drug use: No   Allergies  Allergen Reactions   Banana Other (See Comments)    Stomach upset.   Penicillin G Itching   Tomato Nausea And Vomiting   Medications Prior to Admission  Medication Sig Dispense Refill Last Dose   fluticasone (FLONASE) 50 MCG/ACT nasal spray Place 1 spray into both nostrils daily. (Patient taking differently: Place 1 spray into both nostrils daily as needed for allergies or rhinitis.) 16 g 2    ibuprofen (ADVIL) 800 MG tablet Take 800 mg by mouth 3 (three) times daily.      ondansetron (ZOFRAN) 4 MG tablet Take 1 tablet (4 mg total) by mouth every 8 (eight) hours as needed for nausea or vomiting. 20 tablet 0    topiramate (TOPAMAX) 50 MG tablet Take 3 tablets (150  mg total) by mouth at bedtime. Please follow instructions provided separately in writing 90 tablet 3    I have reviewed patient's Past Medical Hx, Surgical Hx, Family Hx, Social Hx, medications and allergies.   ROS:  Pertinent items noted in HPI and remainder of comprehensive ROS otherwise negative.   Physical Exam  Patient Vitals for the past 24 hrs:  BP Temp Temp src Pulse Resp SpO2 Height Weight  01/22/21 1902 126/81 -- -- 80 -- -- -- --  01/22/21 1800 119/79 98.4 F (36.9 C) Oral 75 19 100 % -- --  01/22/21 1750 -- -- -- -- -- -- 5\' 6"  (1.676 m) 173 lb 8 oz (78.7 kg)   Constitutional: Well-developed, well-nourished female in no acute distress.  Cardiovascular: normal rate & rhythm Respiratory: normal effort GI: Abd soft, non-tender, gravid appropriate for gestational age MS: Extremities nontender, no edema, normal ROM Neurologic: Alert and oriented x 4.  GU: no CVA tenderness Pelvic: exam deferred  Labs: Results for orders placed or performed during the hospital encounter of 01/22/21 (from the past 24 hour(s))  Pregnancy, urine POC     Status: Abnormal   Collection Time: 01/22/21  5:42 PM  Result Value Ref Range   Preg Test, Ur POSITIVE (A) NEGATIVE  Urinalysis, Routine w reflex microscopic Urine, Clean Catch     Status: Abnormal   Collection Time: 01/22/21  6:00 PM  Result Value Ref Range   Color, Urine AMBER (A) YELLOW   APPearance CLOUDY (A) CLEAR   Specific Gravity, Urine 1.024 1.005 - 1.030   pH 5.0 5.0 - 8.0   Glucose, UA NEGATIVE NEGATIVE mg/dL   Hgb urine dipstick SMALL (A) NEGATIVE   Bilirubin Urine NEGATIVE NEGATIVE   Ketones, ur NEGATIVE NEGATIVE mg/dL   Protein, ur 30 (A) NEGATIVE mg/dL   Nitrite NEGATIVE NEGATIVE   Leukocytes,Ua MODERATE (A) NEGATIVE   RBC / HPF 6-10 0 - 5 RBC/hpf   WBC, UA >50 (H) 0 - 5 WBC/hpf   Bacteria, UA FEW (A) NONE SEEN   Squamous Epithelial / LPF 21-50 0 - 5   WBC Clumps PRESENT    Mucus PRESENT    Imaging:  No results  found.  MAU Course: Orders Placed This Encounter  Procedures   Urinalysis, Routine w reflex microscopic Urine, Clean Catch   CBC   Comprehensive metabolic panel   Lipase, blood   Pregnancy, urine POC   Meds ordered this encounter  Medications   lactated ringers bolus 1,000 mL   famotidine (PEPCID) IVPB 20 mg premix   ondansetron (ZOFRAN-ODT) disintegrating  tablet 8 mg   MDM: Lr bolus plus zofran and pepcid IVPB ordered for nausea and epigastric pain Ordered CBC, CMP, lipase due to history of gallbladder disease  Meds and fluids provided good relief of symptoms. Labs showed elevated liver enzymes and lipase. Ordered amylase and upper abdomen ultrasound. Consulted general surgery (Dr. Cliffton Asters) who recommended admission to medicine if U/S shows gallstones. Updated OB attending (Dr. Donavan Foil) and handed off care to Wynelle Bourgeois, CNM at 2115. Edd Arbour, CNM, MSN, IBCLC     Assessment: No diagnosis found.  Plan:  Epic shut down Please see the other copy of this note for complete documentation  Aviva Signs, CNM

## 2021-01-23 ENCOUNTER — Observation Stay (HOSPITAL_COMMUNITY): Payer: Medicaid Other

## 2021-01-23 ENCOUNTER — Encounter (HOSPITAL_COMMUNITY): Payer: Self-pay | Admitting: Internal Medicine

## 2021-01-23 DIAGNOSIS — R1013 Epigastric pain: Secondary | ICD-10-CM

## 2021-01-23 DIAGNOSIS — E876 Hypokalemia: Secondary | ICD-10-CM | POA: Diagnosis present

## 2021-01-23 DIAGNOSIS — K8051 Calculus of bile duct without cholangitis or cholecystitis with obstruction: Secondary | ICD-10-CM | POA: Diagnosis not present

## 2021-01-23 DIAGNOSIS — K8047 Calculus of bile duct with acute and chronic cholecystitis with obstruction: Secondary | ICD-10-CM | POA: Diagnosis not present

## 2021-01-23 DIAGNOSIS — O09521 Supervision of elderly multigravida, first trimester: Secondary | ICD-10-CM | POA: Diagnosis not present

## 2021-01-23 DIAGNOSIS — O26891 Other specified pregnancy related conditions, first trimester: Secondary | ICD-10-CM

## 2021-01-23 DIAGNOSIS — O99891 Other specified diseases and conditions complicating pregnancy: Secondary | ICD-10-CM | POA: Diagnosis not present

## 2021-01-23 DIAGNOSIS — O99611 Diseases of the digestive system complicating pregnancy, first trimester: Secondary | ICD-10-CM | POA: Diagnosis not present

## 2021-01-23 DIAGNOSIS — R7401 Elevation of levels of liver transaminase levels: Secondary | ICD-10-CM | POA: Diagnosis not present

## 2021-01-23 DIAGNOSIS — K8081 Other cholelithiasis with obstruction: Secondary | ICD-10-CM

## 2021-01-23 DIAGNOSIS — Z3490 Encounter for supervision of normal pregnancy, unspecified, unspecified trimester: Secondary | ICD-10-CM

## 2021-01-23 DIAGNOSIS — O26611 Liver and biliary tract disorders in pregnancy, first trimester: Secondary | ICD-10-CM | POA: Diagnosis not present

## 2021-01-23 DIAGNOSIS — K8020 Calculus of gallbladder without cholecystitis without obstruction: Secondary | ICD-10-CM

## 2021-01-23 DIAGNOSIS — R8271 Bacteriuria: Secondary | ICD-10-CM | POA: Diagnosis present

## 2021-01-23 DIAGNOSIS — Z3A10 10 weeks gestation of pregnancy: Secondary | ICD-10-CM | POA: Diagnosis not present

## 2021-01-23 DIAGNOSIS — E871 Hypo-osmolality and hyponatremia: Secondary | ICD-10-CM | POA: Diagnosis present

## 2021-01-23 LAB — CBC WITH DIFFERENTIAL/PLATELET
Abs Immature Granulocytes: 0.02 10*3/uL (ref 0.00–0.07)
Basophils Absolute: 0 10*3/uL (ref 0.0–0.1)
Basophils Relative: 0 %
Eosinophils Absolute: 0 10*3/uL (ref 0.0–0.5)
Eosinophils Relative: 1 %
HCT: 33.8 % — ABNORMAL LOW (ref 36.0–46.0)
Hemoglobin: 11 g/dL — ABNORMAL LOW (ref 12.0–15.0)
Immature Granulocytes: 0 %
Lymphocytes Relative: 42 %
Lymphs Abs: 2.6 10*3/uL (ref 0.7–4.0)
MCH: 26.6 pg (ref 26.0–34.0)
MCHC: 32.5 g/dL (ref 30.0–36.0)
MCV: 81.6 fL (ref 80.0–100.0)
Monocytes Absolute: 0.5 10*3/uL (ref 0.1–1.0)
Monocytes Relative: 8 %
Neutro Abs: 3 10*3/uL (ref 1.7–7.7)
Neutrophils Relative %: 49 %
Platelets: 295 10*3/uL (ref 150–400)
RBC: 4.14 MIL/uL (ref 3.87–5.11)
RDW: 13.2 % (ref 11.5–15.5)
WBC: 6.1 10*3/uL (ref 4.0–10.5)
nRBC: 0 % (ref 0.0–0.2)

## 2021-01-23 LAB — COMPREHENSIVE METABOLIC PANEL
ALT: 444 U/L — ABNORMAL HIGH (ref 0–44)
AST: 201 U/L — ABNORMAL HIGH (ref 15–41)
Albumin: 2.8 g/dL — ABNORMAL LOW (ref 3.5–5.0)
Alkaline Phosphatase: 153 U/L — ABNORMAL HIGH (ref 38–126)
Anion gap: 9 (ref 5–15)
BUN: 5 mg/dL — ABNORMAL LOW (ref 6–20)
CO2: 24 mmol/L (ref 22–32)
Calcium: 8.5 mg/dL — ABNORMAL LOW (ref 8.9–10.3)
Chloride: 101 mmol/L (ref 98–111)
Creatinine, Ser: 0.65 mg/dL (ref 0.44–1.00)
GFR, Estimated: >60 mL/min (ref >60)
Glucose, Bld: 81 mg/dL (ref 70–99)
Potassium: 3.4 mmol/L — ABNORMAL LOW (ref 3.5–5.1)
Sodium: 134 mmol/L — ABNORMAL LOW (ref 135–145)
Total Bilirubin: 1.1 mg/dL (ref 0.3–1.2)
Total Protein: 6.3 g/dL — ABNORMAL LOW (ref 6.5–8.1)

## 2021-01-23 LAB — HEPATITIS B SURFACE ANTIGEN: Hepatitis B Surface Ag: NONREACTIVE

## 2021-01-23 LAB — RESP PANEL BY RT-PCR (FLU A&B, COVID) ARPGX2
Influenza A by PCR: NEGATIVE
Influenza B by PCR: NEGATIVE
SARS Coronavirus 2 by RT PCR: NEGATIVE

## 2021-01-23 LAB — HEPATITIS C ANTIBODY: HCV Ab: NONREACTIVE

## 2021-01-23 LAB — HEPATITIS A ANTIBODY, TOTAL: hep A Total Ab: REACTIVE — AB

## 2021-01-23 LAB — HEPATITIS B CORE ANTIBODY, TOTAL: Hep B Core Total Ab: NONREACTIVE

## 2021-01-23 LAB — HIV ANTIBODY (ROUTINE TESTING W REFLEX): HIV Screen 4th Generation wRfx: NONREACTIVE

## 2021-01-23 MED ORDER — ENOXAPARIN SODIUM 40 MG/0.4ML IJ SOSY
40.0000 mg | PREFILLED_SYRINGE | INTRAMUSCULAR | Status: DC
  Administered 2021-01-23 (×2): 40 mg via SUBCUTANEOUS
  Filled 2021-01-23 (×2): qty 0.4

## 2021-01-23 MED ORDER — MORPHINE SULFATE (PF) 2 MG/ML IV SOLN: 2.0000 mg | INTRAVENOUS | Status: DC | PRN

## 2021-01-23 MED ORDER — MORPHINE SULFATE (PF) 2 MG/ML IV SOLN: 1.0000 mg | INTRAVENOUS | Status: DC | PRN

## 2021-01-23 MED ORDER — ACETAMINOPHEN 650 MG RE SUPP
650.0000 mg | Freq: Four times a day (QID) | RECTAL | Status: DC | PRN
  Filled 2021-01-23: qty 1

## 2021-01-23 MED ORDER — LACTATED RINGERS IV SOLN: INTRAVENOUS | Status: DC

## 2021-01-23 MED ORDER — ACETAMINOPHEN 325 MG PO TABS
650.0000 mg | ORAL_TABLET | Freq: Four times a day (QID) | ORAL | Status: DC | PRN
  Administered 2021-01-23: 650 mg via ORAL
  Filled 2021-01-23: qty 2

## 2021-01-23 MED ORDER — POLYETHYLENE GLYCOL 3350 17 G PO PACK: 17.0000 g | PACK | Freq: Every day | ORAL | Status: DC | PRN

## 2021-01-23 MED ORDER — ONDANSETRON HCL 4 MG/2ML IJ SOLN: 4.0000 mg | Freq: Four times a day (QID) | INTRAMUSCULAR | Status: DC | PRN

## 2021-01-23 MED ORDER — ONDANSETRON HCL 4 MG PO TABS: 4.0000 mg | ORAL_TABLET | Freq: Four times a day (QID) | ORAL | Status: DC | PRN

## 2021-01-23 NOTE — Plan of Care (Signed)

## 2021-01-23 NOTE — Assessment & Plan Note (Signed)
   All symptoms are now consistent with urinary tract infection urinalysis does reveal greater than 50 white blood cells per high-powered field  Considering patient's active pregnancy treating patient with intravenous antibiotics for now while patient is unable to take p.o. we will then transition to oral antibiotics once able.

## 2021-01-23 NOTE — Consult Note (Addendum)
Referring Provider: [facility] PCP: Kristen Ebbs, MD  Gastroenterologist: Althia Forts Reason for consultation:    Possible common bile duct stone               Attending physician's note   I have taken a history, examined the patient and reviewed the chart. I agree with the Advanced Practitioner's note, impression and recommendations.  40 year old very pleasant female, [redacted] weeks pregnant presented with acute episode of epigastric right upper quadrant pain associated with nausea and vomiting.  She has CBD dilation and cholelithiasis.  According to patient pain has completely resolved, she may have potentially passed a CBD stone  Await MRCP to exclude choledocholithiasis Positive for hepatitis A antibody total, will check acute hep A IgM antibody Follow-up LFT daily  Patient will benefit from cholecystectomy at some point to prevent recurrent episodes, timing of surgery as per OB/GYN and surgery team given her pregnancy status  The patient was provided an opportunity to ask questions and all were answered. The patient agreed with the plan and demonstrated an understanding of the instructions.  Damaris Hippo , MD [phone]     ASSESSMENT / PLAN   # 40 yo female who is 10w 1d gestation presenting with nausea / vomiting epigastric / RUQ pain with radiation through to back. She has newly elevated liver chemistries in setting of cholelithiasis and enlarged distal CBD of  8 mm ( without evident stone).  In May 2021 she declined cholecystectomy after presenting with cholecystitis.  She may have choledocholithiasis or possibly passed a stone. Her pain has significantly improved and liver tests improved overnight.  --for precautionary measures will check viral hepatitis studies though suspect liver tests abnormalities related to gallstone disease.  --Await MRCP results --Needs cholecystectomy at some point  # Additional medical history listed below.   HISTORY OF PRESENT  ILLNESS                                                                                                                         Chief Complaint: Upper pain , nausea and vomiting  Kristen Shaw is a 40 y.o. female with a past medical history significant for cholecystitis, tendinitis, headaches see PMH for any additional medical history.    ED course:  Patient is G5P4004 at 10 w 1d presented to MAU yesterday with nausea, vomiting, epigastric pain.  Her liver chemistries were markedly elevated with alk phos 194, AST 488, ALT 654, total bilirubin 1.3.  White count was normal..   Abdominal US 10/20 -- cholelithiasis.  No wall thickening. CBD 8 mm, dilated in the region of the pancreatic head.  Distal obstructing stone cannot be ruled out.   Patient says that she has had nausea and vomiting with all of her pregnancies but starting on Monday (4 days ago ) she had worsening nausea and vomiting.  She was unable to hold down food.  She was concurrently having epigastric pain radiating through to her back and sometimes into her  RUQ.  Over the course of this week her symptoms continue to progress.  Yesterday she was unable to tolerate anything including sips of water so she came to the ED.   Some improvement in liver test overnight.  Alk phos down to 153, AST 201, ALT 44.  Total bili 1.1.  Patient says that she occasionally takes Tylenol.  She has not had any recent NSAID use.  No prescription medication use at home . She denies use of herbal supplements.   In May 2021 patient was seen in the emergency department for nausea, vomiting and RUQ pain..  Imaging showed gallbladder wall thickening with pericholecystic fluid compatible with cholecystitis.  She was seen by surgery, cholecystectomy recommended but patient declined  Imaging:  US Abdomen Complete  Result Date: 01/22/2021 CLINICAL DATA:  Nausea, vomiting, epigastric pain EXAM: ABDOMEN ULTRASOUND COMPLETE COMPARISON:  08/10/2019 FINDINGS:  Gallbladder: Multiple layering gallstones measuring up to 11 mm. No wall thickening or sonographic Murphy sign. Common bile duct: Diameter: Dilated measuring 8 mm. Common bile duct remains dilated in the region of the pancreatic head. No visible stones. Liver: No focal lesion identified. Within normal limits in parenchymal echogenicity. Portal vein is patent on color Doppler imaging with normal direction of blood flow towards the liver. IVC: No abnormality visualized. Pancreas: Visualized portion unremarkable. Spleen: Size and appearance within normal limits. Right Kidney: Length: 12.3 cm. Echogenicity within normal limits. No mass or hydronephrosis visualized. Left Kidney: Length: 10.5 cm.  Cyst Abdominal aorta: No aneurysm visualized. Other findings: None. IMPRESSION: Cholelithiasis. Common bile duct is dilated measuring 8 mm. The duct is dilated into the pancreatic head. Cannot exclude distal obstructing stone. Consider further evaluation with MRCP or ERCP. Electronically Signed   By: Charlett Nose M.D.   On: 01/22/2021 21:52    PREVIOUS ENDOSCOPIC EVALUATIONS  / IMAGING STUDIES   none  Past Medical History:  Diagnosis Date   Migraines     History reviewed. No pertinent surgical history.  Prior to Admission medications   Medication Sig Start Date End Date Taking? Authorizing Provider  fluticasone (FLONASE) 50 MCG/ACT nasal spray Place 1 spray into both nostrils daily. Patient taking differently: Place 1 spray into both nostrils daily as needed for allergies or rhinitis. 11/30/17   Dahlia Byes A, NP  ibuprofen (ADVIL) 800 MG tablet Take 800 mg by mouth 3 (three) times daily. 06/22/19   [provider]  ondansetron (ZOFRAN) 4 MG tablet Take 1 tablet (4 mg total) by mouth every 8 (eight) hours as needed for nausea or vomiting. 08/26/20   Huston Foley, MD  topiramate (TOPAMAX) 50 MG tablet Take 3 tablets (150 mg total) by mouth at bedtime. Please follow instructions provided separately in  writing 08/26/20   Huston Foley, MD    Current Facility-Administered Medications  Medication Dose Route Frequency Provider Last Rate Last Admin   acetaminophen (TYLENOL) tablet 650 mg  650 mg Oral Q6H PRN Marinda Elk, MD   650 mg at 01/23/21 0221   Or   acetaminophen (TYLENOL) suppository 650 mg  650 mg Rectal Q6H PRN Shalhoub, Deno Lunger, MD       cefTRIAXone (ROCEPHIN) 2 g in sodium chloride 0.9 % 100 mL IVPB  2 g Intravenous Q24H Marinda Elk, MD 200 mL/hr at 01/23/21 0032 2 g at 01/23/21 0032   enoxaparin (LOVENOX) injection 40 mg  40 mg Subcutaneous Q24H Marinda Elk, MD   40 mg at 01/23/21 0222   lactated ringers infusion  Intravenous Continuous Donnamae Jude, MD 10 mL/hr at 01/23/21 0818 New Bag at 01/23/21 0818   morphine 2 MG/ML injection 1 mg  1 mg Intravenous Q4H PRN Shalhoub, Sherryll Burger, MD       Or   morphine 2 MG/ML injection 2 mg  2 mg Intravenous Q4H PRN Shalhoub, Sherryll Burger, MD       ondansetron [facility]) tablet 4 mg  4 mg Oral Q6H PRN Shalhoub, Sherryll Burger, MD       Or   ondansetron [facility]) injection 4 mg  4 mg Intravenous Q6H PRN Shalhoub, Sherryll Burger, MD       polyethylene glycol (MIRALAX / GLYCOLAX) packet 17 g  17 g Oral Daily PRN Shalhoub, Sherryll Burger, MD        Allergies as of 01/22/2021 - Review Complete 01/22/2021  Allergen Reaction Noted   Banana Other (See Comments) 07/12/2012   Penicillin g Itching 03/21/2018   Tomato Nausea And Vomiting 07/12/2012    Family History  Problem Relation Age of Onset   Heart disease Neg Hx     Social History   Socioeconomic History   Marital status: Married    Spouse name: Not on file   Number of children: Not on file   Years of education: Not on file   Highest education level: Not on file  Occupational History   Not on file  Tobacco Use   Smoking status: Never   Smokeless tobacco: Never  Substance and Sexual Activity   Alcohol use: No   Drug use: No   Sexual activity: Yes    Birth control/protection:  None  Other Topics Concern   Not on file  Social History Narrative   Not on file   Social Determinants of Health   Financial Resource Strain: Not on file  Food Insecurity: Not on file  Transportation Needs: Not on file  Physical Activity: Not on file  Stress: Not on file  Social Connections: Not on file  Intimate Partner Violence: Not on file    Review of Systems: All systems reviewed and negative except where noted in HPI.   OBJECTIVE    Physical Exam: Vital signs in last 24 hours: Temp:  [98.4 F (36.9 C)-98.9 F (37.2 C)] 98.9 F (37.2 C) (10/21 0821) Pulse Rate:  [70-81] 73 (10/21 0822) Resp:  [16-19] 16 (10/21 0822) BP: (111-126)/(67-81) 111/70 (10/21 0822) SpO2:  [98 %-100 %] 100 % (10/21 0822) Weight:  [78.7 kg] 78.7 kg (10/20 1750)    General:  Alert female in NAD Psych:  Pleasant, cooperative. Normal mood and affect Eyes: Pupils equal, no icterus. Conjunctive pink Ears:  Normal auditory acuity Nose: No deformity, discharge or lesions Neck:  Supple, no masses felt Lungs:  Clear to auscultation.  Heart:  Regular rate, regular rhythm, + murmur. No lower extremity edema Abdomen:  Soft, nondistended, nontender, active bowel sounds, no masses felt Rectal :  Deferred Msk: Symmetrical without gross deformities.  Neurologic:  Alert, oriented, grossly normal neurologically Skin:  Intact without significant lesions.    Scheduled inpatient medications  enoxaparin (LOVENOX) injection  40 mg Subcutaneous Q24H      Intake/Output from previous day: No intake/output data recorded. Intake/Output this shift: No intake/output data recorded.   Lab Results: Recent Labs    01/22/21 1916 01/23/21 0513  WBC 6.5 6.1  HGB 12.7 11.0*  HCT 38.6 33.8*  PLT 307 295   BMET Recent Labs    01/22/21 1916 01/23/21 0513  NA 132* 134*  K 3.4* 3.4*  CL 98 101  CO2 25 24  GLUCOSE 104* 81  BUN 8 5*  CREATININE 0.67 0.65  CALCIUM 9.3 8.5*   LFTs Recent Labs     01/23/21 0513  PROT 6.3*  ALBUMIN 2.8*  AST 201*  ALT 444*  ALKPHOS 153*  BILITOT 1.1   PT/INR Recent Labs    01/22/21 2314  LABPROT 13.9  INR 1.1   Hepatitis Panel No results for input(s): HEPBSAG, HCVAB, HEPAIGM, HEPBIGM in the last 72 hours.   . CBC Latest Ref Rng & Units 01/23/2021 01/22/2021 12/02/2020  WBC 4.0 - 10.5 K/uL 6.1 6.5 5.0  Hemoglobin 12.0 - 15.0 g/dL 11.0(L) 12.7 12.5  Hematocrit 36.0 - 46.0 % 33.8(L) 38.6 39.7  Platelets 150 - 400 K/uL 295 307 371    . CMP Latest Ref Rng & Units 01/23/2021 01/22/2021 12/02/2020  Glucose 70 - 99 mg/dL 81 104(H) 90  BUN 6 - 20 mg/dL 5(L) 8 12  Creatinine 0.44 - 1.00 mg/dL 0.65 0.67 0.64  Sodium 135 - 145 mmol/L 134(L) 132(L) 136  Potassium 3.5 - 5.1 mmol/L 3.4(L) 3.4(L) 3.7  Chloride 98 - 111 mmol/L 101 98 102  CO2 22 - 32 mmol/L $RemoveB'24 25 21  '[id number]$ Calcium 8.9 - 10.3 mg/dL 8.5(L) 9.3 9.4  Total Protein 6.5 - 8.1 g/dL 6.3(L) 8.0 7.7  Total Bilirubin 0.3 - 1.2 mg/dL 1.1 1.3(H) 0.3  Alkaline Phos 38 - 126 U/L 153(H) 194(H) -  AST 15 - 41 U/L 201(H) 488(H) 14  ALT 0 - 44 U/L 444(H) 654(H) 12     Principal Problem:   Choledocholithiasis with obstruction Active Problems:   Hyponatremia   Hypokalemia due to excessive gastrointestinal loss of potassium   Gravida 5, currently pregnant   Bacteriuria during pregnancy    Tye Savoy, NP-C @  01/23/2021, 9:51 AM

## 2021-01-23 NOTE — Assessment & Plan Note (Signed)
   Replacing with intravenous potassium chloride  Monitoring potassium levels with serial chemistries.

## 2021-01-23 NOTE — Progress Notes (Signed)
Patient ID: Kristen Shaw, female   DOB: September 03, 1980, 40 y.o.   MRN: [id number]  PROGRESS NOTE    Kristen Shaw  SWH:[id number] DOB: 1981-02-18 DOA: 01/22/2021 PCP: Fleet Contras, MD    Brief Narrative:  Patient is a 40 year old gravida 5 para 4 at 10 weeks and 1 day gestation who presented to the ED with nausea and vomiting.  She was found to have markedly elevated LFTs, elevated lipase and was sent for right upper quadrant ultrasound.  The right upper quadrant ultrasound was quite concerning for choledocholithiasis.  Patient is admitted to medicine with general surgery and GI consultation and plan for MRCP.  Patient did not have any evidence of cholecystitis.  Stated   Assessment & Plan:   Principal Problem:   Choledocholithiasis with obstruction Active Problems:   Hyponatremia   Hypokalemia due to excessive gastrointestinal loss of potassium   Gravida 5, currently pregnant   Bacteriuria during pregnancy  Choledocholithiasis Markedly abnormal LFTs with elevated lipase and elevated bilirubin.  Dilated common bile duct on ultrasound MRCP done today reveals common bile duct dilation has resolved and her pain and nausea vomiting are also resolved. Suspect she passed the stone MRI also reveals multiple small stones she is at risk for further development of similar issues.  Hyponatremia 132>134, improving Trend  Hypokalemia Still at 3.4 Replete and trend  Acute cystitis On ceftriaxone, will transition to p.o. since she is tolerating this better.  Intrauterine pregnancy at 10 weeks Per OB   DVT prophylaxis: Lovenox SQ Code Status: Full code  Family Communication: Patient at bedside Disposition Plan: Home   Consultants:  GI OB General surgery  Procedures: MRCP  Antimicrobials: Anti-infectives (From admission, onward)    Start     Dose/Rate Route Frequency Ordered Stop   01/22/21 2300  cefTRIAXone (ROCEPHIN) 2 g in sodium chloride 0.9 % 100 mL IVPB         2 g 200 mL/hr over 30 Minutes Intravenous Every 24 hours 01/22/21 2248          Subjective: Patient feels much improved her pain is resolved.  She is tolerating a regular diet.  Objective: Vitals:   01/23/21 0426 01/23/21 0821 01/23/21 0822 01/23/21 1137  BP: 113/74  111/70 112/77  Pulse: 81  73 69  Resp: 18  16 18   Temp: 98.7 F (37.1 C) 98.9 F (37.2 C)  98.2 F (36.8 C)  TempSrc: Oral Oral  Oral  SpO2: 98%  100% 100%  Weight:      Height:       No intake or output data in the 24 hours ending 01/23/21 1305 Filed Weights   01/22/21 1750  Weight: 78.7 kg    Examination:  General exam: Appears calm and comfortable  Respiratory system: Clear to auscultation. Respiratory effort normal. Cardiovascular system: S1 & S2 heard, RRR.  Gastrointestinal system: Abdomen is nondistended, soft and nontender.  Central nervous system: Alert and oriented. No focal neurological deficits. Extremities: Symmetric  Skin: No rashes Psychiatry: Judgement and insight appear normal. Mood & affect appropriate.     Data Reviewed: I have personally reviewed following labs and imaging studies  CBC: Recent Labs  Lab 01/22/21 1916 01/23/21 0513  WBC 6.5 6.1  NEUTROABS  --  3.0  HGB 12.7 11.0*  HCT 38.6 33.8*  MCV 81.3 81.6  PLT 307 295   Basic Metabolic Panel: Recent Labs  Lab 01/22/21 1916 01/23/21 0513  NA 132* 134*  K 3.4* 3.4*  CL 98 101  CO2 25 24  GLUCOSE 104* 81  BUN 8 5*  CREATININE 0.67 0.65  CALCIUM 9.3 8.5*   GFR: Estimated Creatinine Clearance: 101 mL/min (by C-G formula based on SCr of 0.65 mg/dL). Liver Function Tests: Recent Labs  Lab 01/22/21 1916 01/23/21 0513  AST 488* 201*  ALT 654* 444*  ALKPHOS 194* 153*  BILITOT 1.3* 1.1  PROT 8.0 6.3*  ALBUMIN 3.7 2.8*   Recent Labs  Lab 01/22/21 1916 01/22/21 2059  LIPASE 64*  --   AMYLASE  --  89    Coagulation Profile: Recent Labs  Lab 01/22/21 2314  INR 1.1    Sepsis Labs: Recent Results  (from the past 240 hour(s))  Resp Panel by RT-PCR (Flu A&B, Covid) Nasopharyngeal Swab     Status: None   Collection Time: 01/22/21 10:45 PM   Specimen: Nasopharyngeal Swab; Nasopharyngeal(NP) swabs in vial transport medium  Result Value Ref Range Status   SARS Coronavirus 2 by RT PCR NEGATIVE NEGATIVE Final    Comment: (NOTE) SARS-CoV-2 target nucleic acids are NOT DETECTED.  The SARS-CoV-2 RNA is generally detectable in upper respiratory specimens during the acute phase of infection. The lowest concentration of SARS-CoV-2 viral copies this assay can detect is 138 copies/mL. A negative result does not preclude SARS-Cov-2 infection and should not be used as the sole basis for treatment or other patient management decisions. A negative result may occur with  improper specimen collection/handling, submission of specimen other than nasopharyngeal swab, presence of viral mutation(s) within the areas targeted by this assay, and inadequate number of viral copies(<138 copies/mL). A negative result must be combined with clinical observations, patient history, and epidemiological information. The expected result is Negative.  Fact Sheet for Patients:  BloggerCourse.com  Fact Sheet for Healthcare Providers:  SeriousBroker.it  This test is no t yet approved or cleared by the [place] FDA and  has been authorized for detection and/or diagnosis of SARS-CoV-2 by FDA under an Emergency Use Authorization (EUA). This EUA will remain  in effect (meaning this test can be used) for the duration of the COVID-19 declaration under Section 564(b)(1) of the Act, 21 U.S.C.section 360bbb-3(b)(1), unless the authorization is terminated  or revoked sooner.       Influenza A by PCR NEGATIVE NEGATIVE Final   Influenza B by PCR NEGATIVE NEGATIVE Final    Comment: (NOTE) The Xpert Xpress SARS-CoV-2/FLU/RSV plus assay is intended as an aid in the  diagnosis of influenza from Nasopharyngeal swab specimens and should not be used as a sole basis for treatment. Nasal washings and aspirates are unacceptable for Xpert Xpress SARS-CoV-2/FLU/RSV testing.  Fact Sheet for Patients: BloggerCourse.com  Fact Sheet for Healthcare Providers: SeriousBroker.it  This test is not yet approved or cleared by the [place] FDA and has been authorized for detection and/or diagnosis of SARS-CoV-2 by FDA under an Emergency Use Authorization (EUA). This EUA will remain in effect (meaning this test can be used) for the duration of the COVID-19 declaration under Section 564(b)(1) of the Act, 21 U.S.C. section 360bbb-3(b)(1), unless the authorization is terminated or revoked.  Performed at [facility], [address], [place], [place] [address]       Radiology Studies: US Abdomen Complete  Result Date: 01/22/2021 CLINICAL DATA:  Nausea, vomiting, epigastric pain EXAM: ABDOMEN ULTRASOUND COMPLETE COMPARISON:  08/10/2019 FINDINGS: Gallbladder: Multiple layering gallstones measuring up to 11 mm. No wall thickening or sonographic Murphy sign. Common bile duct: Diameter: Dilated measuring 8 mm. Common bile  duct remains dilated in the region of the pancreatic head. No visible stones. Liver: No focal lesion identified. Within normal limits in parenchymal echogenicity. Portal vein is patent on color Doppler imaging with normal direction of blood flow towards the liver. IVC: No abnormality visualized. Pancreas: Visualized portion unremarkable. Spleen: Size and appearance within normal limits. Right Kidney: Length: 12.3 cm. Echogenicity within normal limits. No mass or hydronephrosis visualized. Left Kidney: Length: 10.5 cm.  Cyst Abdominal aorta: No aneurysm visualized. Other findings: None. IMPRESSION: Cholelithiasis. Common bile duct is dilated measuring 8 mm. The duct is dilated into the pancreatic head.  Cannot exclude distal obstructing stone. Consider further evaluation with MRCP or ERCP. Electronically Signed   By: Charlett Nose M.D.   On: 01/22/2021 21:52     Scheduled Meds:  enoxaparin (LOVENOX) injection  40 mg Subcutaneous Q24H   Continuous Infusions:  cefTRIAXone (ROCEPHIN)  IV 2 g (01/23/21 0032)   lactated ringers 10 mL/hr at 01/23/21 0818     LOS: 0 days    Reva Bores, MD 01/23/2021 1:05 PM [phone] [facility] If 7PM-7AM, please contact night-coverage 01/23/2021, 1:05 PM

## 2021-01-23 NOTE — Assessment & Plan Note (Signed)
   Appropriately choosing and dosing all medications that are as pregnancy safe as possible per current literature and guidelines  Outpatient follow-up with OB/GYN

## 2021-01-23 NOTE — Assessment & Plan Note (Signed)
   Mild hypovolemic hyponatremia secondary to frequent bouts of nausea vomiting  Hydrating patient with intravenous isotonic fluid  Monitoring sodium levels with serial chemistries.

## 2021-01-23 NOTE — Consult Note (Signed)
OBSTETRICS AND GYNECOLOGY ATTENDING CONSULT NOTE  Consult Date: 01/23/2021 Reason for Consult: Pregnancy at [redacted]w[redacted]d Consulting Provider: Dr. Leafy Half   Assessment/Plan: Pregnancy at [redacted]w[redacted]d - If she needs surgery, she can have FHT prior to surgery and after surgery once she is awake of to hear them (for pt reassurance) - Once tolerating PO, she may resume PNV but not necessary while N/V - HgB is 11.0 but this is appropriate for pregnancy  Choledocholithiasis with obstruction - Management as per primary team. Patient and I discussed that if surgery is indicated that surgery during this time or in second trimester is what is safest for her but more important is that if surgery is indicated, then it would be considered safe and advisable to proceed.  - Reviewed with pt MRI is considered safe in pregnancy - FHT would be done pre and postop.   3. GBS bactiuria  - This is from a UCX done in May  - No ABX indicated for this purpose at this time   Appreciate care of Kristen Shaw by her primary team. We will sign off but please contact us if additional questions.   Please call [phone] [facility] OB/[facility] Consult Attending Monday-Friday 8am - 5pm) or [phone] [facility] OB/GYN Attending On Call all day, every day) for any obstetric concern at any time.  Thank you for involving [place] in the care of this patient.  Total consultation time including face-to-face time with patient (>50% of time), reviewing chart and documentation: 20 minutes  Milas Hock MD, FACOG Obstetrician & Gynecologist, [facility] for [facility], [facility] Consult Phone: [phone]   History of Present Illness: Kristen Shaw is an 40 y.o. [id number] female [birth year] at [redacted]w[redacted]d who was admitted for N/V  and abdominal pain in the s/o known cholelithiasis. She has made dietary changes but due to the persistent N/V she presented to [facility]. Her work up included RUQ [place] which showed possible stone CBD.    Pertinent OB/GYN History: Patient's last menstrual period was 11/12/2020. OB History  Gravida Para Term Preterm AB Living  5 4 4  0 0 4  SAB IAB Ectopic Multiple Live Births  0 0 0 0 4    # Outcome Date GA Lbr Len/2nd Weight Sex Delivery Anes PTL Lv  5 Current           4 Term 01/03/13 [redacted]w[redacted]d 20:00 / 00:34 3436 g F Vag-Spont Local  LIV     Birth Comments: Girl Marva Kosch is a 7 lb 9.2 oz (3436 g) female infant born at Gestational Age: [redacted]w[redacted]d.   Prenatal & Delivery Information Mother, Kristen Shaw , is a 39 y.o.  [phone] .   Prenatal labs ABO/Rh A/POS/-- (04/09 1035)   Antibody NEG (04/09 1035)   Rubella 6.78 (04/09 1035)   RPR NON REACTIVE (10/01 1321)   HBsAG NEGATIVE (04/09 1035)   HIV NON REACTIVE (09/10 1128)   GBS Negative (09/10 0000)       Prenatal care: late at 15 wks.   Pregnancy complications: Breech s/p version   Delivery complications: None Date & time of delivery: 01/03/2013, 9:34 PM Route of delivery: Vaginal, Spontaneous Delivery. Apgar scores: 9 at 1 minute, 9 at 5 minutes. ROM: 01/03/2013, 12:25 Pm, Spontaneous, Light Meconium.  9 hours prior to delivery Maternal antibiotics: None     Nursery Course past 24 hours:  Doing well. VSS, breast feeding X 9  (LATCH 10), 4 voids and  and 4 stools.  Family ready for  discharge today, discharge instructions done using Arabic interpreter via phone, family had no concerns.     Screening Tests, Labs & Immunizations: Infant Blood Type:  Not indicated   Infant DAT:  Not indicated   HepB vaccine: Given on 01/03/2013 Newborn screen: DRAWN BY RN  (10/02 2345) Hearing Screen: Right Ear: Pass (10/03 0425)           Left Ear: Pass (10/03 0425) Transcutaneous bilirubin: 4.9 /25 hours (10/02 2318), risk zoneLow. Risk factors for jaundice:None Congenital Heart Screening:    Age at Inititial Screening: 25 hours Initial Screening Pulse 02 saturation of RIGHT hand: 100 % Pulse 02 saturation of Foot: 100  % Difference (right hand - foot): 0 % Pass / Fail: Pass      Feeding: Formula Feed for Exclusion:   No        Name: REYANNA, BALEY     Apgar1: 9  Apgar5: 9  3 Term 09/12/09 [redacted]w[redacted]d   M Vag-Spont None N LIV  2 Term 09/05/07 [redacted]w[redacted]d   F Vag-Spont None N LIV  1 Term 12/02/04 [redacted]w[redacted]d   F Vag-Spont None N LIV     Patient Active Problem List   Diagnosis Date Noted   Choledocholithiasis with obstruction 01/23/2021   Hyponatremia 01/23/2021   Hypokalemia due to excessive gastrointestinal loss of potassium 01/23/2021   Gravida 5, currently pregnant 01/23/2021   Bacteriuria during pregnancy 01/23/2021   Migraine 03/21/2018    Past Medical History:  Diagnosis Date   Migraines     History reviewed. No pertinent surgical history.  Family History  Problem Relation Age of Onset   Heart disease Neg Hx     Social History:  reports that she has never smoked. She has never used smokeless tobacco. She reports that she does not drink alcohol and does not use drugs.  Allergies:  Allergies  Allergen Reactions   Banana Other (See Comments)    Stomach upset.   Penicillin G Itching   Tomato Nausea And Vomiting    Medications: I have reviewed the patient's current medications.  Review of Systems: Pertinent items are noted in HPI.  Focused Physical Examination: BP 111/70 (BP Location: Left Arm)   Pulse 73   Temp 98.9 F (37.2 C) (Oral)   Resp 16   Ht 5\' 6"  (1.676 m)   Wt 78.7 kg   LMP 11/12/2020   SpO2 100%   BMI 28.00 kg/m  CONSTITUTIONAL: Well-developed, well-nourished female in no acute distress.  NEUROLOGIC: Alert and oriented to person, place, and time. Normal reflexes, muscle tone coordination. No cranial nerve deficit noted. PSYCHIATRIC: Normal mood and affect. Normal behavior. Normal judgment and thought content. CARDIOVASCULAR: Normal heart rate noted, regular rhythm RESPIRATORY:  Effort and breath sounds normal, no problems with respiration noted. ABDOMEN: Not  examined.   PELVIC: Deferred MUSCULOSKELETAL: Normal range of motion. No tenderness.  No cyanosis, clubbing, or edema.  2+ distal pulses.  Labs and Imaging: Results for orders placed or performed during the hospital encounter of 01/22/21 (from the past 72 hour(s))  Pregnancy, urine POC     Status: Abnormal   Collection Time: 01/22/21  5:42 PM  Result Value Ref Range   Preg Test, Ur POSITIVE (A) NEGATIVE    Comment:        THE SENSITIVITY OF THIS METHODOLOGY IS >24 mIU/mL   Urinalysis, Routine w reflex microscopic Urine, Clean Catch     Status: Abnormal   Collection Time: 01/22/21  6:00 PM  Result Value Ref  Range   Color, Urine AMBER (A) YELLOW    Comment: BIOCHEMICALS MAY BE AFFECTED BY COLOR   APPearance CLOUDY (A) CLEAR   Specific Gravity, Urine 1.024 1.005 - 1.030   pH 5.0 5.0 - 8.0   Glucose, UA NEGATIVE NEGATIVE mg/dL   Hgb urine dipstick SMALL (A) NEGATIVE   Bilirubin Urine NEGATIVE NEGATIVE   Ketones, ur NEGATIVE NEGATIVE mg/dL   Protein, ur 30 (A) NEGATIVE mg/dL   Nitrite NEGATIVE NEGATIVE   Leukocytes,Ua MODERATE (A) NEGATIVE   RBC / HPF 6-10 0 - 5 RBC/hpf   WBC, UA >50 (H) 0 - 5 WBC/hpf   Bacteria, UA FEW (A) NONE SEEN   Squamous Epithelial / LPF 21-50 0 - 5   WBC Clumps PRESENT    Mucus PRESENT     Comment: Performed at [facility], [address], [place], [place] [address]  CBC     Status: None   Collection Time: 01/22/21  7:16 PM  Result Value Ref Range   WBC 6.5 4.0 - 10.5 K/uL   RBC 4.75 3.87 - 5.11 MIL/uL   Hemoglobin 12.7 12.0 - 15.0 g/dL   HCT 10.1 75.1 - 02.5 %   MCV 81.3 80.0 - 100.0 fL   MCH 26.7 26.0 - 34.0 pg   MCHC 32.9 30.0 - 36.0 g/dL   RDW 85.2 77.8 - 24.2 %   Platelets 307 150 - 400 K/uL   nRBC 0.0 0.0 - 0.2 %    Comment: Performed at [facility], [address] [place], [place] [address]  Comprehensive metabolic panel     Status: Abnormal   Collection Time: 01/22/21  7:16 PM  Result Value Ref Range   Sodium 132 (L) 135  - 145 mmol/L   Potassium 3.4 (L) 3.5 - 5.1 mmol/L   Chloride 98 98 - 111 mmol/L   CO2 25 22 - 32 mmol/L   Glucose, Bld 104 (H) 70 - 99 mg/dL    Comment: Glucose reference range applies only to samples taken after fasting for at least 8 hours.   BUN 8 6 - 20 mg/dL   Creatinine, Ser 4.43 0.44 - 1.00 mg/dL   Calcium 9.3 8.9 - 15.4 mg/dL   Total Protein 8.0 6.5 - 8.1 g/dL   Albumin 3.7 3.5 - 5.0 g/dL   AST 008 (H) 15 - 41 U/L   ALT 654 (H) 0 - 44 U/L   Alkaline Phosphatase 194 (H) 38 - 126 U/L   Total Bilirubin 1.3 (H) 0.3 - 1.2 mg/dL   GFR, Estimated >67 >61 mL/min    Comment: (NOTE) Calculated using the CKD-EPI Creatinine Equation (2021)    Anion gap 9 5 - 15    Comment: Performed at [facility], [address] [place], [place] [address]  Lipase, blood     Status: Abnormal   Collection Time: 01/22/21  7:16 PM  Result Value Ref Range   Lipase 64 (H) 11 - 51 U/L    Comment: Performed at [facility], [address], [place], [place] [address]  Amylase     Status: None   Collection Time: 01/22/21  8:59 PM  Result Value Ref Range   Amylase 89 28 - 100 U/L    Comment: Performed at [facility], [address] [place], [place] [address]  Resp Panel by RT-PCR (Flu A&B, Covid) Nasopharyngeal Swab     Status: None   Collection Time: 01/22/21 10:45 PM  Specimen: Nasopharyngeal Swab; Nasopharyngeal(NP) swabs in vial transport medium  Result Value Ref Range   SARS Coronavirus 2 by RT PCR NEGATIVE NEGATIVE    Comment: (NOTE) SARS-CoV-2 target nucleic acids are NOT DETECTED.  The SARS-CoV-2 RNA is generally detectable in upper respiratory specimens during the acute phase of infection. The lowest concentration of SARS-CoV-2 viral copies this assay can detect is 138 copies/mL. A negative result does not preclude SARS-Cov-2 infection and should not be used as the sole basis for treatment or other patient management decisions. A negative result may occur with  improper  specimen collection/handling, submission of specimen other than nasopharyngeal swab, presence of viral mutation(s) within the areas targeted by this assay, and inadequate number of viral copies(<138 copies/mL). A negative result must be combined with clinical observations, patient history, and epidemiological information. The expected result is Negative.  Fact Sheet for Patients:  BloggerCourse.com  Fact Sheet for Healthcare Providers:  SeriousBroker.it  This test is no t yet approved or cleared by the [place] FDA and  has been authorized for detection and/or diagnosis of SARS-CoV-2 by FDA under an Emergency Use Authorization (EUA). This EUA will remain  in effect (meaning this test can be used) for the duration of the COVID-19 declaration under Section 564(b)(1) of the Act, 21 U.S.C.section 360bbb-3(b)(1), unless the authorization is terminated  or revoked sooner.       Influenza A by PCR NEGATIVE NEGATIVE   Influenza B by PCR NEGATIVE NEGATIVE    Comment: (NOTE) The Xpert Xpress SARS-CoV-2/FLU/RSV plus assay is intended as an aid in the diagnosis of influenza from Nasopharyngeal swab specimens and should not be used as a sole basis for treatment. Nasal washings and aspirates are unacceptable for Xpert Xpress SARS-CoV-2/FLU/RSV testing.  Fact Sheet for Patients: BloggerCourse.com  Fact Sheet for Healthcare Providers: SeriousBroker.it  This test is not yet approved or cleared by the [place] FDA and has been authorized for detection and/or diagnosis of SARS-CoV-2 by FDA under an Emergency Use Authorization (EUA). This EUA will remain in effect (meaning this test can be used) for the duration of the COVID-19 declaration under Section 564(b)(1) of the Act, 21 U.S.C. section 360bbb-3(b)(1), unless the authorization is terminated or revoked.  Performed at [facility], [address] [place], [place] [address]   Protime-INR     Status: None   Collection Time: 01/22/21 11:14 PM  Result Value Ref Range   Prothrombin Time 13.9 11.4 - 15.2 seconds   INR 1.1 0.8 - 1.2    Comment: (NOTE) INR goal varies based on device and disease states. Performed at [facility], [address] [place], [place] [address]   APTT     Status: None   Collection Time: 01/22/21 11:14 PM  Result Value Ref Range   aPTT 26 24 - 36 seconds    Comment: Performed at [facility], [address] [place], [place] [address]  HIV Antibody (routine testing w rflx)     Status: None   Collection Time: 01/23/21  5:13 AM  Result Value Ref Range   HIV Screen 4th Generation wRfx Non Reactive Non Reactive    Comment: Performed at [facility], [address], [place], [place] [address]  Comprehensive metabolic panel     Status: Abnormal   Collection Time: 01/23/21  5:13 AM  Result Value Ref Range   Sodium 134 (L) 135 - 145 mmol/L   Potassium 3.4 (L) 3.5 - 5.1  mmol/L   Chloride 101 98 - 111 mmol/L   CO2 24 22 - 32 mmol/L   Glucose, Bld 81 70 - 99 mg/dL    Comment: Glucose reference range applies only to samples taken after fasting for at least 8 hours.   BUN 5 (L) 6 - 20 mg/dL   Creatinine, Ser 7.84 0.44 - 1.00 mg/dL   Calcium 8.5 (L) 8.9 - 10.3 mg/dL   Total Protein 6.3 (L) 6.5 - 8.1 g/dL   Albumin 2.8 (L) 3.5 - 5.0 g/dL   AST 696 (H) 15 - 41 U/L   ALT 444 (H) 0 - 44 U/L   Alkaline Phosphatase 153 (H) 38 - 126 U/L   Total Bilirubin 1.1 0.3 - 1.2 mg/dL   GFR, Estimated >29 >52 mL/min    Comment: (NOTE) Calculated using the CKD-EPI Creatinine Equation (2021)    Anion gap 9 5 - 15    Comment: Performed at [facility], [address], [place], [place] [address]  CBC WITH DIFFERENTIAL     Status: Abnormal   Collection Time: 01/23/21  5:13 AM  Result Value Ref Range   WBC 6.1 4.0 - 10.5 K/uL   RBC 4.14 3.87 - 5.11 MIL/uL   Hemoglobin 11.0 (L)  12.0 - 15.0 g/dL   HCT 44.0 (L) 10.2 - 72.5 %   MCV 81.6 80.0 - 100.0 fL   MCH 26.6 26.0 - 34.0 pg   MCHC 32.5 30.0 - 36.0 g/dL   RDW 36.6 44.0 - 34.7 %   Platelets 295 150 - 400 K/uL   nRBC 0.0 0.0 - 0.2 %   Neutrophils Relative % 49 %   Neutro Abs 3.0 1.7 - 7.7 K/uL   Lymphocytes Relative 42 %   Lymphs Abs 2.6 0.7 - 4.0 K/uL   Monocytes Relative 8 %   Monocytes Absolute 0.5 0.1 - 1.0 K/uL   Eosinophils Relative 1 %   Eosinophils Absolute 0.0 0.0 - 0.5 K/uL   Basophils Relative 0 %   Basophils Absolute 0.0 0.0 - 0.1 K/uL   Immature Granulocytes 0 %   Abs Immature Granulocytes 0.02 0.00 - 0.07 K/uL    Comment: Performed at [facility], [address] [place], [place] [address]    US Abdomen Complete  Result Date: 01/22/2021 CLINICAL DATA:  Nausea, vomiting, epigastric pain EXAM: ABDOMEN ULTRASOUND COMPLETE COMPARISON:  08/10/2019 FINDINGS: Gallbladder: Multiple layering gallstones measuring up to 11 mm. No wall thickening or sonographic Murphy sign. Common bile duct: Diameter: Dilated measuring 8 mm. Common bile duct remains dilated in the region of the pancreatic head. No visible stones. Liver: No focal lesion identified. Within normal limits in parenchymal echogenicity. Portal vein is patent on color Doppler imaging with normal direction of blood flow towards the liver. IVC: No abnormality visualized. Pancreas: Visualized portion unremarkable. Spleen: Size and appearance within normal limits. Right Kidney: Length: 12.3 cm. Echogenicity within normal limits. No mass or hydronephrosis visualized. Left Kidney: Length: 10.5 cm.  Cyst Abdominal aorta: No aneurysm visualized. Other findings: None. IMPRESSION: Cholelithiasis. Common bile duct is dilated measuring 8 mm. The duct is dilated into the pancreatic head. Cannot exclude distal obstructing stone. Consider further evaluation with MRCP or ERCP. Electronically Signed   By: Charlett Nose M.D.   On: 01/22/2021 21:52

## 2021-01-23 NOTE — Assessment & Plan Note (Signed)
   Patient exhibiting 4 day history of severe epigastric pain associate with frequent bouts of nausea vomiting and poor oral intake  Work-up reveals substantial transaminitis, elevated alkaline phosphatase and hyperbilirubinemia consistent with cholestasis  Right upper quad ultrasound revealing dilated common bile duct with concern for impacted common bile duct stone  MRCP ordered without contrast for further evaluation to confirm choledocholithiasis  No clinical evidence of concurrent cholangitis  Hydrating patient with intravenous isotonic fluids  As needed analgesics and antiemetics for associated pain  Will additionally obtain gastroenterology consultation  OB/GYN midwife did additionally reach out to Dr. Cliffton Asters with general surgery who stated that they will come evaluate the patient in the morning in consultation.

## 2021-01-23 NOTE — H&P (Signed)
History and Physical    Kristen Shaw NAT:[id number] DOB: 1980-08-22 DOA: 01/22/2021  PCP: Fleet Contras, MD  Patient coming from: Home   Chief Complaint:  Chief Complaint  Patient presents with   Nausea   Emesis     HPI:    40 year old female G5 P4-0-0-4  currently at [redacted]w[redacted]d with additional past medical history of cholelithiasis who initially presented to maternity admissions with complaints of nausea and vomiting.  History has been obtained using an Print production planner.  Patient explained that since Monday she has been experiencing frequent bouts of nausea and vomiting.  This has been associated with epigastric and right upper quadrant pain, typically occurring in conjunction with her episodes of nausea and vomiting.  Epigastric pain is sharp in quality, severe in intensity and occasionally radiating to the back.  Patient complains of associated poor oral intake over the span of time.  Patient denies fevers, sick contacts, recent ingestion of undercooked food, recent travel.  Pain as well as episodic nausea and vomiting continue to persist until she eventually presented to maternity admissions at [facility].  Upon arrival to maternity admissions at [facility] patient was administered a bolus of Lex Ringer's solution in addition to intravenous Zofran and intravenous Pepcid.  Initial work-up revealed evidence of substantial transaminitis including AST of 488 and ALT of 654.  This prompted a discussion between the midwife and Dr. Cliffton Asters with general surgery who recommended obtaining a right upper quadrant ultrasound.  Right upper quadrant ultrasound was obtained revealing common bile duct dilatation of 8 mm all the way along the pancreatic head.  Radiology recommended obtaining MRCP or ERCP.  The hospitalist group has now been called to assess the patient for admission to the medical service.  Review of Systems:   Review of Systems  Gastrointestinal:  Positive for  abdominal pain, nausea and vomiting.  All other systems reviewed and are negative.  Past Medical History:  Diagnosis Date   Migraines     History reviewed. No pertinent surgical history.   reports that she has never smoked. She has never used smokeless tobacco. She reports that she does not drink alcohol and does not use drugs.  Allergies  Allergen Reactions   Banana Other (See Comments)    Stomach upset.   Penicillin G Itching   Tomato Nausea And Vomiting    Family History  Problem Relation Age of Onset   Heart disease Neg Hx      Prior to Admission medications   Medication Sig Start Date End Date Taking? Authorizing Provider  fluticasone (FLONASE) 50 MCG/ACT nasal spray Place 1 spray into both nostrils daily. Patient taking differently: Place 1 spray into both nostrils daily as needed for allergies or rhinitis. 11/30/17   Dahlia Byes A, NP  ibuprofen (ADVIL) 800 MG tablet Take 800 mg by mouth 3 (three) times daily. 06/22/19   [provider]  ondansetron (ZOFRAN) 4 MG tablet Take 1 tablet (4 mg total) by mouth every 8 (eight) hours as needed for nausea or vomiting. 08/26/20   Huston Foley, MD  topiramate (TOPAMAX) 50 MG tablet Take 3 tablets (150 mg total) by mouth at bedtime. Please follow instructions provided separately in writing 08/26/20   Huston Foley, MD    Physical Exam: Vitals:   01/22/21 1750 01/22/21 1800 01/22/21 1902 01/23/21 0018  BP:  119/79 126/81 119/67  Pulse:  75 80 70  Resp:  19  18  Temp:  98.4 F (36.9 C)  98.7 F (  37.1 C)  TempSrc:  Oral  Oral  SpO2:  100%  100%  Weight: 78.7 kg     Height: 5\' 6"  (1.676 m)       Constitutional: Awake alert and oriented x3, no associated distress.   Skin: no rashes, no lesions, good skin turgor noted. Eyes: Pupils are equally reactive to light.  No evidence of scleral icterus or conjunctival pallor.  ENMT: Somewhat dry mucous membranes noted.  Posterior pharynx clear of any exudate or lesions.   Neck:  normal, supple, no masses, no thyromegaly.  No evidence of jugular venous distension.   Respiratory: clear to auscultation bilaterally, no wheezing, no crackles. Normal respiratory effort. No accessory muscle use.  Cardiovascular: Regular rate and rhythm, no murmurs / rubs / gallops. No extremity edema. 2+ pedal pulses. No carotid bruits.  Chest:   Nontender without crepitus or deformity.   Back:   Nontender without crepitus or deformity. Abdomen: Notable epigastric and right upper quadrant tenderness on palpation.  Abdomen is soft.  Palpation of lower abdomen consistent with current gestational age.  positive bowel sounds noted in all quadrants.   Musculoskeletal: No joint deformity upper and lower extremities. Good ROM, no contractures. Normal muscle tone.  Neurologic: CN 2-12 grossly intact. Sensation intact.  Patient moving all 4 extremities spontaneously.  Patient is following all commands.  Patient is responsive to verbal stimuli.   Psychiatric: Patient exhibits normal mood with appropriate affect.  Patient seems to possess insight as to their current situation.     Labs on Admission: I have personally reviewed following labs and imaging studies -   CBC: Recent Labs  Lab 01/22/21 1916  WBC 6.5  HGB 12.7  HCT 38.6  MCV 81.3  PLT 307   Basic Metabolic Panel: Recent Labs  Lab 01/22/21 1916  NA 132*  K 3.4*  CL 98  CO2 25  GLUCOSE 104*  BUN 8  CREATININE 0.67  CALCIUM 9.3   GFR: Estimated Creatinine Clearance: 101 mL/min (by C-G formula based on SCr of 0.67 mg/dL). Liver Function Tests: Recent Labs  Lab 01/22/21 1916  AST 488*  ALT 654*  ALKPHOS 194*  BILITOT 1.3*  PROT 8.0  ALBUMIN 3.7   Recent Labs  Lab 01/22/21 1916 01/22/21 2059  LIPASE 64*  --   AMYLASE  --  89   No results for input(s): AMMONIA in the last 168 hours. Coagulation Profile: Recent Labs  Lab 01/22/21 2314  INR 1.1   Cardiac Enzymes: No results for input(s): CKTOTAL, CKMB, CKMBINDEX,  TROPONINI in the last 168 hours. BNP (last 3 results) No results for input(s): PROBNP in the last 8760 hours. HbA1C: No results for input(s): HGBA1C in the last 72 hours. CBG: No results for input(s): GLUCAP in the last 168 hours. Lipid Profile: No results for input(s): CHOL, HDL, LDLCALC, TRIG, CHOLHDL, LDLDIRECT in the last 72 hours. Thyroid Function Tests: No results for input(s): TSH, T4TOTAL, FREET4, T3FREE, THYROIDAB in the last 72 hours. Anemia Panel: No results for input(s): VITAMINB12, FOLATE, FERRITIN, TIBC, IRON, RETICCTPCT in the last 72 hours. Urine analysis:    Component Value Date/Time   COLORURINE AMBER (A) 01/22/2021 1800   APPEARANCEUR CLOUDY (A) 01/22/2021 1800   LABSPEC 1.024 01/22/2021 1800   PHURINE 5.0 01/22/2021 1800   GLUCOSEU NEGATIVE 01/22/2021 1800   HGBUR SMALL (A) 01/22/2021 1800   BILIRUBINUR NEGATIVE 01/22/2021 1800   KETONESUR NEGATIVE 01/22/2021 1800   PROTEINUR 30 (A) 01/22/2021 1800   UROBILINOGEN 0.2 09/06/2013 1349  NITRITE NEGATIVE 01/22/2021 1800   LEUKOCYTESUR MODERATE (A) 01/22/2021 1800    Radiological Exams on Admission - Personally Reviewed: US Abdomen Complete  Result Date: 01/22/2021 CLINICAL DATA:  Nausea, vomiting, epigastric pain EXAM: ABDOMEN ULTRASOUND COMPLETE COMPARISON:  08/10/2019 FINDINGS: Gallbladder: Multiple layering gallstones measuring up to 11 mm. No wall thickening or sonographic Murphy sign. Common bile duct: Diameter: Dilated measuring 8 mm. Common bile duct remains dilated in the region of the pancreatic head. No visible stones. Liver: No focal lesion identified. Within normal limits in parenchymal echogenicity. Portal vein is patent on color Doppler imaging with normal direction of blood flow towards the liver. IVC: No abnormality visualized. Pancreas: Visualized portion unremarkable. Spleen: Size and appearance within normal limits. Right Kidney: Length: 12.3 cm. Echogenicity within normal limits. No mass or  hydronephrosis visualized. Left Kidney: Length: 10.5 cm.  Cyst Abdominal aorta: No aneurysm visualized. Other findings: None. IMPRESSION: Cholelithiasis. Common bile duct is dilated measuring 8 mm. The duct is dilated into the pancreatic head. Cannot exclude distal obstructing stone. Consider further evaluation with MRCP or ERCP. Electronically Signed   By: Charlett Nose M.D.   On: 01/22/2021 21:52     Assessment/Plan  * Choledocholithiasis with obstruction Patient exhibiting 4 day history of severe epigastric pain associate with frequent bouts of nausea vomiting and poor oral intake Work-up reveals substantial transaminitis, elevated alkaline phosphatase and hyperbilirubinemia consistent with cholestasis Right upper quad ultrasound revealing dilated common bile duct with concern for impacted common bile duct stone MRCP ordered without contrast for further evaluation to confirm choledocholithiasis No clinical evidence of concurrent cholangitis Hydrating patient with intravenous isotonic fluids As needed analgesics and antiemetics for associated pain Will additionally obtain gastroenterology consultation OB/GYN midwife did additionally reach out to Dr. Cliffton Asters with general surgery who stated that they will come evaluate the patient in the morning in consultation.  Hyponatremia Mild hypovolemic hyponatremia secondary to frequent bouts of nausea vomiting Hydrating patient with intravenous isotonic fluid Monitoring sodium levels with serial chemistries.  Hypokalemia due to excessive gastrointestinal loss of potassium Replacing with intravenous potassium chloride Monitoring potassium levels with serial chemistries.   Bacteriuria during pregnancy All symptoms are now consistent with urinary tract infection urinalysis does reveal greater than 50 white blood cells per high-powered field Considering patient's active pregnancy treating patient with intravenous antibiotics for now while patient is  unable to take p.o. we will then transition to oral antibiotics once able.  Gravida 5, currently pregnant Appropriately choosing and dosing all medications that are as pregnancy safe as possible per current literature and guidelines Outpatient follow-up with OB/GYN     Code Status:  Full code  code status decision has been confirmed with: patient Family Communication: Family at bedside who has been updated on plan of care  Status is: Observation  The patient remains OBS appropriate and will d/c before 2 midnights.       Marinda Elk MD [facility] Pager [phone]  If 7PM-7AM, please contact night-coverage [email or web address] Use universal [place] password for that web site. If you do not have the password, please call the hospital operator.  01/23/2021, 2:15 AM

## 2021-01-24 DIAGNOSIS — K8031 Calculus of bile duct with cholangitis, unspecified, with obstruction: Secondary | ICD-10-CM | POA: Diagnosis not present

## 2021-01-24 DIAGNOSIS — O99891 Other specified diseases and conditions complicating pregnancy: Secondary | ICD-10-CM | POA: Diagnosis not present

## 2021-01-24 DIAGNOSIS — K8020 Calculus of gallbladder without cholecystitis without obstruction: Secondary | ICD-10-CM

## 2021-01-24 DIAGNOSIS — O26611 Liver and biliary tract disorders in pregnancy, first trimester: Secondary | ICD-10-CM

## 2021-01-24 DIAGNOSIS — R7401 Elevation of levels of liver transaminase levels: Secondary | ICD-10-CM

## 2021-01-24 DIAGNOSIS — O09521 Supervision of elderly multigravida, first trimester: Secondary | ICD-10-CM | POA: Diagnosis not present

## 2021-01-24 DIAGNOSIS — K8051 Calculus of bile duct without cholangitis or cholecystitis with obstruction: Secondary | ICD-10-CM | POA: Diagnosis not present

## 2021-01-24 LAB — COMPREHENSIVE METABOLIC PANEL
ALT: 311 U/L — ABNORMAL HIGH (ref 0–44)
AST: 78 U/L — ABNORMAL HIGH (ref 15–41)
Albumin: 3 g/dL — ABNORMAL LOW (ref 3.5–5.0)
Alkaline Phosphatase: 132 U/L — ABNORMAL HIGH (ref 38–126)
Anion gap: 10 (ref 5–15)
BUN: 8 mg/dL (ref 6–20)
CO2: 22 mmol/L (ref 22–32)
Calcium: 8.8 mg/dL — ABNORMAL LOW (ref 8.9–10.3)
Chloride: 101 mmol/L (ref 98–111)
Creatinine, Ser: 0.62 mg/dL (ref 0.44–1.00)
GFR, Estimated: >60 mL/min (ref >60)
Glucose, Bld: 95 mg/dL (ref 70–99)
Potassium: 3.7 mmol/L (ref 3.5–5.1)
Sodium: 133 mmol/L — ABNORMAL LOW (ref 135–145)
Total Bilirubin: 0.6 mg/dL (ref 0.3–1.2)
Total Protein: 6.6 g/dL (ref 6.5–8.1)

## 2021-01-24 LAB — HEPATITIS B SURFACE ANTIBODY, QUANTITATIVE: Hep B S AB Quant (Post): 31.5 m[IU]/mL (ref >9.9)

## 2021-01-24 LAB — HEPATITIS A ANTIBODY, IGM: Hep A IgM: NONREACTIVE

## 2021-01-24 MED ORDER — ONDANSETRON HCL 4 MG PO TABS: 4.0000 mg | ORAL_TABLET | Freq: Three times a day (TID) | ORAL | 0 refills | Status: DC | PRN

## 2021-01-24 MED ORDER — CEPHALEXIN 500 MG PO CAPS: 500.0000 mg | ORAL_CAPSULE | Freq: Three times a day (TID) | ORAL | Status: DC

## 2021-01-24 MED ORDER — CEPHALEXIN 500 MG PO CAPS: 500.0000 mg | ORAL_CAPSULE | Freq: Three times a day (TID) | ORAL | 0 refills | Status: AC

## 2021-01-24 NOTE — Discharge Summary (Signed)
Physician Discharge Summary   Patient ID: Kristen Shaw MRN: [id number] DOB/AGE: 1980/05/02 40 y.o.  Admit date: 01/22/2021 Discharge date: 01/24/2021  Primary Care Physician:  Fleet Contras, MD   Recommendations for Outpatient Follow-up:  Follow up with PCP in 1-2 weeks Patient recommended low fat diet, she has declined to undergo cholecystectomy until after delivery Keflex 500 mg 3 times daily for 6 more days  Home Health: None Equipment/Devices:   Discharge Condition: stable  CODE STATUS: FULL Diet recommendation: Heart healthy diet, low-fat diet   Discharge Diagnoses:     Choledocholithiasis with obstruction  Hyponatremia  Hypokalemia due to excessive gastrointestinal loss of potassium  Bacteriuria during pregnancy with acute cystitis Intrauterine pregnancy at 10 weeks   Consults:   OB Gastroenterology    Allergies:   Allergies  Allergen Reactions   Banana Other (See Comments)    Stomach upset.   Penicillin G Itching   Tomato Nausea And Vomiting     DISCHARGE MEDICATIONS: Allergies as of 01/24/2021       Reactions   Banana Other (See Comments)   Stomach upset.   Penicillin G Itching   Tomato Nausea And Vomiting        Medication List     STOP taking these medications    ibuprofen 800 MG tablet Commonly known as: ADVIL       TAKE these medications    cephALEXin 500 MG capsule Commonly known as: KEFLEX Take 1 capsule (500 mg total) by mouth 3 (three) times daily for 6 days.   fluticasone 50 MCG/ACT nasal spray Commonly known as: FLONASE Place 1 spray into both nostrils daily. What changed:  when to take this reasons to take this   ondansetron 4 MG tablet Commonly known as: Zofran Take 1 tablet (4 mg total) by mouth every 8 (eight) hours as needed for nausea or vomiting.   topiramate 50 MG tablet Commonly known as: TOPAMAX Take 3 tablets (150 mg total) by mouth at bedtime. Please follow instructions provided  separately in writing         Brief H and P: For complete details please refer to admission H and P, but in brief patient is a 40 year old female with pregnancy at 10 weeks presented to ED with nausea and vomiting.  She was found to have markedly elevated LFTs, lipase.  Right upper quadrant ultrasound was concerning for choledocholithiasis. Patient was admitted to medicine for further work-up.  Hospital Course:   Choledocholithiasis with transaminitis -Presented with nausea and vomiting, found to have elevated lipase and LFTs -Right upper quadrant ultrasound showed cholelithiasis, CBD dilated measuring 8 mm cannot exclude distal obstructing stone -Placed on n.p.o. status, IV fluids and antiemetics -GI was consulted.  Patient underwent MRCP which showed small gallstones present in the cystic duct.  Resolution of the biliary duct distention that was seen on the recent ultrasound may reflect recent stone passage. -Patient declined to have cholecystectomy even though per OB it was safe to have cholecystectomy in first or second trimester.  She remains at risk for recurrent choledocholithiasis and cholecystitis. -LFTs now trending down, tolerating diet.  Patient was recommended low-fat diet and return back if worsening symptoms with nausea vomiting or abdominal pain. Evaluated by GI prior to discharge.  Acute cystitis -Patient was placed on ceftriaxone IV while inpatient.  Given symptoms of acute cystitis, transition to oral Keflex for 6 more days to complete full course of 7 days.  Hyponatremia -Likely due to poor p.o. intake, nausea and vomiting -Patient  was provided with IV fluids, currently tolerating diet  Hypokalemia - resolved     Day of Discharge S: No acute complaints, declined cholecystectomy.  Wants to have the surgery after the delivery.  Wants to go home.  BP 112/81 (BP Location: Left Arm)   Pulse 73   Temp 99 F (37.2 C) (Oral)   Resp 18   Ht 5\' 6"  (1.676 m)   Wt  78.7 kg   LMP 11/12/2020   SpO2 100%   BMI 28.00 kg/m   Physical Exam: General: Alert and awake oriented x3 not in any acute distress. CVS: S1-S2 clear no murmur rubs or gallops Chest: clear to auscultation bilaterally, no wheezing rales or rhonchi Abdomen: soft nontender, nondistended, normal bowel sounds Extremities: no cyanosis, clubbing or edema noted bilaterally Neuro: Cranial nerves II-XII intact, no focal neurological deficits    Get Medicines reviewed and adjusted: Please take all your medications with you for your next visit with your Primary MD  Please request your Primary MD to go over all hospital tests and procedure/radiological results at the follow up. Please ask your Primary MD to get all Hospital records sent to his/her office.  If you experience worsening of your admission symptoms, develop shortness of breath, life threatening emergency, suicidal or homicidal thoughts you must seek medical attention immediately by calling 911 or calling your MD immediately  if symptoms less severe.  You must read complete instructions/literature along with all the possible adverse reactions/side effects for all the Medicines you take and that have been prescribed to you. Take any new Medicines after you have completely understood and accept all the possible adverse reactions/side effects.   Do not drive when taking pain medications.   Do not take more than prescribed Pain, Sleep and Anxiety Medications  Special Instructions: If you have smoked or chewed Tobacco  in the last 2 yrs please stop smoking, stop any regular Alcohol  and or any Recreational drug use.  Wear Seat belts while driving.  Please note  You were cared for by a hospitalist during your hospital stay. Once you are discharged, your primary care physician will handle any further medical issues. Please note that NO REFILLS for any discharge medications will be authorized once you are discharged, as it is imperative  that you return to your primary care physician (or establish a relationship with a primary care physician if you do not have one) for your aftercare needs so that they can reassess your need for medications and monitor your lab values.   The results of significant diagnostics from this hospitalization (including imaging, microbiology, ancillary and laboratory) are listed below for reference.      Procedures/Studies:  01/12/2021 Abdomen Complete  Result Date: 01/22/2021 CLINICAL DATA:  Nausea, vomiting, epigastric pain EXAM: ABDOMEN ULTRASOUND COMPLETE COMPARISON:  08/10/2019 FINDINGS: Gallbladder: Multiple layering gallstones measuring up to 11 mm. No wall thickening or sonographic Murphy sign. Common bile duct: Diameter: Dilated measuring 8 mm. Common bile duct remains dilated in the region of the pancreatic head. No visible stones. Liver: No focal lesion identified. Within normal limits in parenchymal echogenicity. Portal vein is patent on color Doppler imaging with normal direction of blood flow towards the liver. IVC: No abnormality visualized. Pancreas: Visualized portion unremarkable. Spleen: Size and appearance within normal limits. Right Kidney: Length: 12.3 cm. Echogenicity within normal limits. No mass or hydronephrosis visualized. Left Kidney: Length: 10.5 cm.  Cyst Abdominal aorta: No aneurysm visualized. Other findings: None. IMPRESSION: Cholelithiasis. Common bile duct is  dilated measuring 8 mm. The duct is dilated into the pancreatic head. Cannot exclude distal obstructing stone. Consider further evaluation with MRCP or ERCP. Electronically Signed   By: Charlett Nose M.D.   On: 01/22/2021 21:52   MR ABDOMEN MRCP WO CONTRAST  Result Date: 01/23/2021 CLINICAL DATA:  Cholelithiasis, common bile duct obstruction suspected in a 40 year old female. EXAM: MRI ABDOMEN WITHOUT CONTRAST  (INCLUDING MRCP) TECHNIQUE: Multiplanar multisequence MR imaging of the abdomen was performed. Heavily T2-weighted  images of the biliary and pancreatic ducts were obtained, and three-dimensional MRCP images were rendered by post processing. COMPARISON:  Abdominal sonogram from January 22, 2021. FINDINGS: Lower chest: Incidental imaging of the lung bases without effusion or sign of consolidative process, limited assessment on MRI. Hepatobiliary: Smooth hepatic contours. No gross fat or iron in the liver. Assessment on some sequences limited by respiratory motion. Cholelithiasis with numerous small gallstones filling the gallbladder lumen measuring 5 mm or less. Gallstones in the cystic duct. No choledocholithiasis. Common bile duct no longer dilated. No visible hepatic lesion on noncontrast imaging. Pancreas: No signs of pancreatic edema. Intrinsic T1 signal of the pancreas is preserved. No ductal dilation. No peripancreatic fluid. Spleen:  Normal size and contour. Adrenals/Urinary Tract: Adrenal glands are normal. No visible renal lesion or hydronephrosis on noncontrast imaging. Stomach/Bowel: No acute gastrointestinal process to the extent evaluated on abdominal MRI. Vascular/Lymphatic: Normal caliber of the abdominal aorta. No adenopathy in the abdomen. Other:  No ascites. Musculoskeletal: No suspicious bone lesions identified. IMPRESSION: The gallbladder is filled with small gallstones. Gallstones present in the cystic duct. Resolution of the biliary duct distension that was seen on the recent ultrasound may reflect recent stone passage. Note that given the number and location of gallstones the patient is at risk for recurrent choledocholithiasis. No signs of pericholecystic edema or edematous changes about the pancreas. Correlation with laboratory values is suggested. Electronically Signed   By: Donzetta Kohut M.D.   On: 01/23/2021 17:18      LAB RESULTS: Basic Metabolic Panel: Recent Labs  Lab 01/23/21 0513 01/24/21 0335  NA 134* 133*  K 3.4* 3.7  CL 101 101  CO2 24 22  GLUCOSE 81 95  BUN 5* 8  CREATININE  0.65 0.62  CALCIUM 8.5* 8.8*   Liver Function Tests: Recent Labs  Lab 01/23/21 0513 01/24/21 0335  AST 201* 78*  ALT 444* 311*  ALKPHOS 153* 132*  BILITOT 1.1 0.6  PROT 6.3* 6.6  ALBUMIN 2.8* 3.0*   Recent Labs  Lab 01/22/21 1916 01/22/21 2059  LIPASE 64*  --   AMYLASE  --  89   No results for input(s): AMMONIA in the last 168 hours. CBC: Recent Labs  Lab 01/22/21 1916 01/23/21 0513  WBC 6.5 6.1  NEUTROABS  --  3.0  HGB 12.7 11.0*  HCT 38.6 33.8*  MCV 81.3 81.6  PLT 307 295   Cardiac Enzymes: No results for input(s): CKTOTAL, CKMB, CKMBINDEX, TROPONINI in the last 168 hours. BNP: Invalid input(s): POCBNP CBG: No results for input(s): GLUCAP in the last 168 hours.     Disposition and Follow-up: Discharge Instructions     Diet - low sodium heart healthy   Complete by: As directed    Discharge instructions   Complete by: As directed    LOW FAT DIET   Increase activity slowly   Complete by: As directed         DISPOSITION: Home   DISCHARGE FOLLOW-UP  Follow-up Information  Fleet Contras, MD. Schedule an appointment as soon as possible for a visit in 2 week(s).   Specialty: Internal Medicine Why: for hospital follow-up Contact information: [address] [place] [address] [phone]                  Time coordinating discharge:  35 minutes  Signed:   Thad Ranger M.D. [facility] 01/24/2021, 11:09 AM

## 2021-01-24 NOTE — Progress Notes (Signed)
Progress Note    ASSESSMENT AND PLAN:   Choledocholithiasis-it appears that she has passed the CBD stone. No further abdominal pain.  Neg MRCP. No pancreatitis or ascending cholangitis. Cholelithiasis without acute cholecystitis 10 weeks+ gestation  Plan: -Low-fat diet -Patient needs to follow-up with surgery after delivery. -MRCP reviewed independently -Trend LFTs as outpatient. -Will sign off for now.     SUBJECTIVE   Doing well. No abdominal pain. Does not want lap chole at this time.  Willing to get it done after delivery Seen in presence of Dr. Isidoro Donning    OBJECTIVE:     Vital signs in last 24 hours: Temp:  [98.2 F (36.8 C)-98.9 F (37.2 C)] 98.8 F (37.1 C) (10/22 0423) Pulse Rate:  [69-78] 70 (10/22 0423) Resp:  [16-18] 18 (10/21 1801) BP: (111-124)/(61-81) 124/81 (10/22 0423) SpO2:  [100 %] 100 % (10/22 0423)   General:   Alert, well-developed female in NAD Not examined   Intake/Output from previous day: 10/21 0701 - 10/22 0700 In: 161.5 [I.V.:61.5; IV Piggyback:100] Out: -  Intake/Output this shift: No intake/output data recorded.  Lab Results: Recent Labs    01/22/21 1916 01/23/21 0513  WBC 6.5 6.1  HGB 12.7 11.0*  HCT 38.6 33.8*  PLT 307 295   BMET Recent Labs    01/22/21 1916 01/23/21 0513 01/24/21 0335  NA 132* 134* 133*  K 3.4* 3.4* 3.7  CL 98 101 101  CO2 25 24 22   GLUCOSE 104* 81 95  BUN 8 5* 8  CREATININE 0.67 0.65 0.62  CALCIUM 9.3 8.5* 8.8*   LFT Recent Labs    01/24/21 0335  PROT 6.6  ALBUMIN 3.0*  AST 78*  ALT 311*  ALKPHOS 132*  BILITOT 0.6   PT/INR Recent Labs    01/22/21 2314  LABPROT 13.9  INR 1.1   Hepatitis Panel Recent Labs    01/23/21 1028  HEPBSAG NON REACTIVE  HCVAB NON REACTIVE    01/25/21 Abdomen Complete  Result Date: 01/22/2021 CLINICAL DATA:  Nausea, vomiting, epigastric pain EXAM: ABDOMEN ULTRASOUND COMPLETE COMPARISON:  08/10/2019 FINDINGS: Gallbladder: Multiple layering  gallstones measuring up to 11 mm. No wall thickening or sonographic Murphy sign. Common bile duct: Diameter: Dilated measuring 8 mm. Common bile duct remains dilated in the region of the pancreatic head. No visible stones. Liver: No focal lesion identified. Within normal limits in parenchymal echogenicity. Portal vein is patent on color Doppler imaging with normal direction of blood flow towards the liver. IVC: No abnormality visualized. Pancreas: Visualized portion unremarkable. Spleen: Size and appearance within normal limits. Right Kidney: Length: 12.3 cm. Echogenicity within normal limits. No mass or hydronephrosis visualized. Left Kidney: Length: 10.5 cm.  Cyst Abdominal aorta: No aneurysm visualized. Other findings: None. IMPRESSION: Cholelithiasis. Common bile duct is dilated measuring 8 mm. The duct is dilated into the pancreatic head. Cannot exclude distal obstructing stone. Consider further evaluation with MRCP or ERCP. Electronically Signed   By: 10/10/2019 M.D.   On: 01/22/2021 21:52   MR ABDOMEN MRCP WO CONTRAST  Result Date: 01/23/2021 CLINICAL DATA:  Cholelithiasis, common bile duct obstruction suspected in a 40 year old female. EXAM: MRI ABDOMEN WITHOUT CONTRAST  (INCLUDING MRCP) TECHNIQUE: Multiplanar multisequence MR imaging of the abdomen was performed. Heavily T2-weighted images of the biliary and pancreatic ducts were obtained, and three-dimensional MRCP images were rendered by post processing. COMPARISON:  Abdominal sonogram from January 22, 2021. FINDINGS: Lower chest: Incidental imaging of the lung bases without effusion or  sign of consolidative process, limited assessment on MRI. Hepatobiliary: Smooth hepatic contours. No gross fat or iron in the liver. Assessment on some sequences limited by respiratory motion. Cholelithiasis with numerous small gallstones filling the gallbladder lumen measuring 5 mm or less. Gallstones in the cystic duct. No choledocholithiasis. Common bile duct no  longer dilated. No visible hepatic lesion on noncontrast imaging. Pancreas: No signs of pancreatic edema. Intrinsic T1 signal of the pancreas is preserved. No ductal dilation. No peripancreatic fluid. Spleen:  Normal size and contour. Adrenals/Urinary Tract: Adrenal glands are normal. No visible renal lesion or hydronephrosis on noncontrast imaging. Stomach/Bowel: No acute gastrointestinal process to the extent evaluated on abdominal MRI. Vascular/Lymphatic: Normal caliber of the abdominal aorta. No adenopathy in the abdomen. Other:  No ascites. Musculoskeletal: No suspicious bone lesions identified. IMPRESSION: The gallbladder is filled with small gallstones. Gallstones present in the cystic duct. Resolution of the biliary duct distension that was seen on the recent ultrasound may reflect recent stone passage. Note that given the number and location of gallstones the patient is at risk for recurrent choledocholithiasis. No signs of pericholecystic edema or edematous changes about the pancreas. Correlation with laboratory values is suggested. Electronically Signed   By: Donzetta Kohut M.D.   On: 01/23/2021 17:18     Principal Problem:   Choledocholithiasis with obstruction Active Problems:   Hyponatremia   Hypokalemia due to excessive gastrointestinal loss of potassium   Gravida 5, currently pregnant   Bacteriuria during pregnancy   Cholelithiasis affecting pregnancy in first trimester, antepartum   Elevated transaminase level   Epigastric abdominal pain affecting pregnancy in first trimester     LOS: 0 days     Edman Circle, MD 01/24/2021, 7:48 AM [facility] [phone]

## 2021-02-03 ENCOUNTER — Ambulatory Visit: Payer: Medicaid Other

## 2021-02-03 ENCOUNTER — Other Ambulatory Visit: Payer: Self-pay

## 2021-02-03 ENCOUNTER — Ambulatory Visit (INDEPENDENT_AMBULATORY_CARE_PROVIDER_SITE_OTHER): Payer: Medicaid Other

## 2021-02-03 DIAGNOSIS — Z3200 Encounter for pregnancy test, result unknown: Secondary | ICD-10-CM

## 2021-02-03 DIAGNOSIS — Z3201 Encounter for pregnancy test, result positive: Secondary | ICD-10-CM | POA: Diagnosis not present

## 2021-02-03 LAB — POCT PREGNANCY, URINE: Preg Test, Ur: POSITIVE — AB

## 2021-02-03 NOTE — Progress Notes (Signed)
Possible Pregnancy  Here today to leave urine specimen for pregnancy confirmation. UPT in office today is positive.   [facility] office will follow up with patient for results and recommendation.   Marjo Bicker, RN 02/03/2021  1:14 PM

## 2021-02-09 ENCOUNTER — Ambulatory Visit (INDEPENDENT_AMBULATORY_CARE_PROVIDER_SITE_OTHER): Payer: Medicaid Other

## 2021-02-09 ENCOUNTER — Other Ambulatory Visit: Payer: Self-pay

## 2021-02-09 VITALS — BP 103/71 | HR 74 | Temp 98.1°F | Wt 175.0 lb

## 2021-02-09 DIAGNOSIS — O219 Vomiting of pregnancy, unspecified: Secondary | ICD-10-CM

## 2021-02-09 DIAGNOSIS — Z3480 Encounter for supervision of other normal pregnancy, unspecified trimester: Secondary | ICD-10-CM | POA: Insufficient documentation

## 2021-02-09 DIAGNOSIS — Z3A12 12 weeks gestation of pregnancy: Secondary | ICD-10-CM

## 2021-02-09 MED ORDER — PROMETHAZINE HCL 25 MG PO TABS: 25.0000 mg | ORAL_TABLET | Freq: Four times a day (QID) | ORAL | 0 refills | Status: DC | PRN

## 2021-02-09 NOTE — Progress Notes (Signed)
    I discussed the limitations, risks, security and privacy concerns of performing an evaluation and management service by telephone and the availability of in person appointments. I also discussed with the patient that there may be a patient responsible charge related to this service. The patient expressed understanding and agreed to proceed.   Location: [facility] Patient: clinic Provider: clinic Interpreter used: a telephonic interpreter Osborne Casco [phone]  PRENATAL INTAKE SUMMARY  Ms. Kristen Shaw presents today New OB Nurse Interview.  OB History     Gravida  5   Para  4   Term  4   Preterm      AB      Living  4      SAB      IAB      Ectopic      Multiple      Live Births  4          I have reviewed the patient's medical, obstetrical, social, and family histories, medications, and available lab results.  SUBJECTIVE She has complains of abdominal pain in the left upper quadrant. Also, complains of a headache 3-4 times a week with tylenol as relief.   OBJECTIVE Initial Nurse interview for history/labs (New OB)  last menstrual period date: 11/12/2020 EDD: 08/20/2021  GA: [redacted]w[redacted]d [id number] FHT: 159  GENERAL APPEARANCE: alert, well appearing, in no apparent distress, oriented to person, place and time   ASSESSMENT Pregnancy with choledocholithiasis   PLAN Prenatal care:  [facility] Pnl/HIV/Hep C OB Urine Culture GC/CT- with pap at next visit  PAP approximate date 10/17/2017 and was normal HgbEval/SMA/CF ([facility]) Panorama A1C CMP- Liver enzyme was high on last CMP    Blood Pressure Monitor/Weight Scale  In person visits only   MyChart/Babyscripts MyChart access verified. I explained pt will have some visits in office and some virtually. Babyscripts instructions given and order placed.   Placed OB Box on problem list and updated   Followup with Gerrit Heck, CNM on 02/20/2021    I discussed the assessment and treatment plan with  the patient. The patient was provided an opportunity to ask questions and all were answered. The patient agreed with the plan and demonstrated an understanding of the instructions.   The patient was advised to call back or seek an in-person evaluation if the symptoms worsen or if the condition fails to improve as anticipated.  I provided 40 minutes of  face-to-face time during this encounter.  Gearldine Shown, CMA

## 2021-02-10 LAB — COMPREHENSIVE METABOLIC PANEL
ALT: 13 IU/L (ref 0–32)
AST: 14 IU/L (ref 0–40)
Albumin/Globulin Ratio: 1.5 (ref 1.2–2.2)
Albumin: 4.2 g/dL (ref 3.8–4.8)
Alkaline Phosphatase: 87 IU/L (ref 44–121)
BUN/Creatinine Ratio: 12 (ref 9–23)
BUN: 8 mg/dL (ref 6–20)
Bilirubin Total: <0.2 mg/dL (ref 0.0–1.2)
CO2: 22 mmol/L (ref 20–29)
Calcium: 9.2 mg/dL (ref 8.7–10.2)
Chloride: 100 mmol/L (ref 96–106)
Creatinine, Ser: 0.69 mg/dL (ref 0.57–1.00)
Globulin, Total: 2.8 g/dL (ref 1.5–4.5)
Glucose: 86 mg/dL (ref 70–99)
Potassium: 3.9 mmol/L (ref 3.5–5.2)
Sodium: 135 mmol/L (ref 134–144)
Total Protein: 7 g/dL (ref 6.0–8.5)
eGFR: 113 mL/min/{1.73_m2} (ref >59)

## 2021-02-10 LAB — HEMOGLOBIN A1C
Est. average glucose Bld gHb Est-mCnc: 114 mg/dL
Hgb A1c MFr Bld: 5.6 % (ref 4.8–5.6)

## 2021-02-10 LAB — OBSTETRIC PANEL, INCLUDING HIV
Antibody Screen: NEGATIVE
Basophils Absolute: 0 10*3/uL (ref 0.0–0.2)
Basos: 1 %
EOS (ABSOLUTE): 0.1 10*3/uL (ref 0.0–0.4)
Eos: 1 %
HIV Screen 4th Generation wRfx: NONREACTIVE
Hematocrit: 36.5 % (ref 34.0–46.6)
Hemoglobin: 11.9 g/dL (ref 11.1–15.9)
Hepatitis B Surface Ag: NEGATIVE
Immature Grans (Abs): 0 10*3/uL (ref 0.0–0.1)
Immature Granulocytes: 0 %
Lymphocytes Absolute: 1.9 10*3/uL (ref 0.7–3.1)
Lymphs: 38 %
MCH: 26.4 pg — ABNORMAL LOW (ref 26.6–33.0)
MCHC: 32.6 g/dL (ref 31.5–35.7)
MCV: 81 fL (ref 79–97)
Monocytes Absolute: 0.3 10*3/uL (ref 0.1–0.9)
Monocytes: 6 %
Neutrophils Absolute: 2.8 10*3/uL (ref 1.4–7.0)
Neutrophils: 54 %
Platelets: 264 10*3/uL (ref 150–450)
RBC: 4.51 x10E6/uL (ref 3.77–5.28)
RDW: 12.3 % (ref 11.7–15.4)
RPR Ser Ql: NONREACTIVE
Rh Factor: POSITIVE
Rubella Antibodies, IGG: 8.46 index (ref >0.99)
WBC: 5.1 10*3/uL (ref 3.4–10.8)

## 2021-02-10 LAB — HEPATITIS C ANTIBODY: Hep C Virus Ab: <0.1 s/co ratio (ref 0.0–0.9)

## 2021-02-12 LAB — CULTURE, OB URINE

## 2021-02-12 LAB — URINE CULTURE, OB REFLEX

## 2021-02-14 ENCOUNTER — Inpatient Hospital Stay (HOSPITAL_COMMUNITY)
Admission: AD | Admit: 2021-02-14 | Discharge: 2021-02-15 | Disposition: A | Discharge status: Home / Self Care | Payer: Medicaid Other | Attending: Obstetrics and Gynecology | Admitting: Obstetrics and Gynecology

## 2021-02-14 ENCOUNTER — Other Ambulatory Visit: Payer: Self-pay

## 2021-02-14 ENCOUNTER — Encounter (HOSPITAL_COMMUNITY): Payer: Self-pay | Admitting: Obstetrics and Gynecology

## 2021-02-14 ENCOUNTER — Inpatient Hospital Stay (HOSPITAL_COMMUNITY): Payer: Medicaid Other

## 2021-02-14 DIAGNOSIS — O209 Hemorrhage in early pregnancy, unspecified: Secondary | ICD-10-CM

## 2021-02-14 DIAGNOSIS — M545 Low back pain, unspecified: Secondary | ICD-10-CM | POA: Diagnosis not present

## 2021-02-14 DIAGNOSIS — O034 Incomplete spontaneous abortion without complication: Secondary | ICD-10-CM | POA: Diagnosis not present

## 2021-02-14 DIAGNOSIS — Z3A13 13 weeks gestation of pregnancy: Secondary | ICD-10-CM | POA: Diagnosis not present

## 2021-02-14 DIAGNOSIS — R1030 Lower abdominal pain, unspecified: Secondary | ICD-10-CM | POA: Insufficient documentation

## 2021-02-14 DIAGNOSIS — Z6790 Unspecified blood type, Rh positive: Secondary | ICD-10-CM

## 2021-02-14 DIAGNOSIS — O09521 Supervision of elderly multigravida, first trimester: Secondary | ICD-10-CM | POA: Insufficient documentation

## 2021-02-14 DIAGNOSIS — Z3480 Encounter for supervision of other normal pregnancy, unspecified trimester: Secondary | ICD-10-CM | POA: Diagnosis not present

## 2021-02-14 LAB — CBC
HCT: 34.2 % — ABNORMAL LOW (ref 36.0–46.0)
Hemoglobin: 11.2 g/dL — ABNORMAL LOW (ref 12.0–15.0)
MCH: 27.2 pg (ref 26.0–34.0)
MCHC: 32.7 g/dL (ref 30.0–36.0)
MCV: 83 fL (ref 80.0–100.0)
Platelets: 308 10*3/uL (ref 150–400)
RBC: 4.12 MIL/uL (ref 3.87–5.11)
RDW: 13.4 % (ref 11.5–15.5)
WBC: 7.4 10*3/uL (ref 4.0–10.5)
nRBC: 0 % (ref 0.0–0.2)

## 2021-02-14 LAB — URINALYSIS, ROUTINE W REFLEX MICROSCOPIC
Bacteria, UA: NONE SEEN
Bilirubin Urine: NEGATIVE
Glucose, UA: NEGATIVE mg/dL
Ketones, ur: NEGATIVE mg/dL
Leukocytes,Ua: NEGATIVE
Nitrite: NEGATIVE
Protein, ur: NEGATIVE mg/dL
Specific Gravity, Urine: 1.017 (ref 1.005–1.030)
pH: 5 (ref 5.0–8.0)

## 2021-02-14 MED ORDER — TRAMADOL HCL 50 MG PO TABS: 50.0000 mg | ORAL_TABLET | Freq: Four times a day (QID) | ORAL | 0 refills | Status: DC | PRN

## 2021-02-14 MED ORDER — PROMETHAZINE HCL 12.5 MG PO TABS: 12.5000 mg | ORAL_TABLET | Freq: Four times a day (QID) | ORAL | 0 refills | Status: DC | PRN

## 2021-02-14 MED ORDER — MISOPROSTOL 200 MCG PO TABS: 600.0000 ug | ORAL_TABLET | Freq: Once | ORAL | 0 refills | Status: DC

## 2021-02-14 NOTE — MAU Note (Signed)
Pt reports severe belly pain and back pain and passing small clots since yesterday.  Bleeing is more brown colored noe.

## 2021-02-14 NOTE — MAU Provider Note (Signed)
Chief Complaint: Vaginal Bleeding   Event Date/Time   First Provider Initiated Contact with Patient 02/14/21 2126      SUBJECTIVE HPI: Kristen Shaw is a 40 y.o. G5P4004 at [redacted]w[redacted]d by LMP who is s/p who presents to maternity admissions reporting onset of vaginal bleeding on 02/13/21 with onset of back and abdominal pain today, 02/14/21.  She describes the bleeding as light, with clots yesterday and brown, like tissue today.  She is wearing a pad but has not soaked a full pad with the bleeding.   She denies any vaginal itching/burning, urinary symptoms, h/a, dizziness, n/v, or fever/chills.     Location: low back, low abdomen Quality: cramping Severity: 9/10 on pain scale Duration: 1 day Timing: intermittent Modifying factors: none Associated signs and symptoms: vaginal bleeding  HPI  Past Medical History:  Diagnosis Date   Choledocholithiasis with acute cholecystitis    Migraines    History reviewed. No pertinent surgical history. Social History   Socioeconomic History   Marital status: Married    Spouse name: Mussa Mohaned   Number of children: 4   Years of education: Not on file   Highest education level: High school graduate  Occupational History   Occupation: Stay at home mom  Tobacco Use   Smoking status: Never   Smokeless tobacco: Never  Vaping Use   Vaping Use: Never used  Substance and Sexual Activity   Alcohol use: No   Drug use: No   Sexual activity: Yes    Birth control/protection: None  Other Topics Concern   Not on file  Social History Narrative   Not on file   Social Determinants of Health   Financial Resource Strain: Not on file  Food Insecurity: Not on file  Transportation Needs: Not on file  Physical Activity: Not on file  Stress: Not on file  Social Connections: Not on file  Intimate Partner Violence: Not on file   No current facility-administered medications on file prior to encounter.   Current Outpatient Medications on File Prior  to Encounter  Medication Sig Dispense Refill   ondansetron (ZOFRAN) 4 MG tablet Take 1 tablet (4 mg total) by mouth every 8 (eight) hours as needed for nausea or vomiting. 30 tablet 0   promethazine (PHENERGAN) 25 MG tablet Take 1 tablet (25 mg total) by mouth every 6 (six) hours as needed for nausea or vomiting. 30 tablet 0   fluticasone (FLONASE) 50 MCG/ACT nasal spray Place 1 spray into both nostrils daily. (Patient taking differently: Place 1 spray into both nostrils daily as needed for allergies or rhinitis.) 16 g 2   topiramate (TOPAMAX) 50 MG tablet Take 3 tablets (150 mg total) by mouth at bedtime. Please follow instructions provided separately in writing (Patient not taking: Reported on 02/09/2021) 90 tablet 3   Allergies  Allergen Reactions   Banana Other (See Comments)    Stomach upset.   Penicillin G Itching   Tomato Nausea And Vomiting    ROS:  Review of Systems  Constitutional:  Negative for chills, fatigue and fever.  Respiratory:  Negative for shortness of breath.   Cardiovascular:  Negative for chest pain.  Gastrointestinal:  Positive for abdominal pain. Negative for nausea and vomiting.  Genitourinary:  Positive for vaginal bleeding. Negative for difficulty urinating, dysuria, flank pain, pelvic pain, vaginal discharge and vaginal pain.  Musculoskeletal:  Positive for back pain.  Neurological:  Negative for dizziness and headaches.  Psychiatric/Behavioral: Negative.      I have reviewed patient's  Past Medical Hx, Surgical Hx, Family Hx, Social Hx, medications and allergies.   Physical Exam  Patient Vitals for the past 24 hrs:  BP Temp Pulse Resp Height Weight  02/14/21 2046 117/87 98.4 F (36.9 C) 76 18 5\' 6"  (1.676 m) 78.5 kg   Constitutional: Well-developed, well-nourished female in no acute distress.  Cardiovascular: normal rate Respiratory: normal effort GI: Abd soft, non-tender. Pos BS x 4 MS: Extremities nontender, no edema, normal ROM Neurologic: Alert  and oriented x 4.  GU: Neg CVAT.  FHT not heard by doppler  LAB RESULTS   Results for orders placed or performed during the hospital encounter of 02/14/21 (from the past 24 hour(s))  Urinalysis, Routine w reflex microscopic Urine, Clean Catch     Status: Abnormal   Collection Time: 02/14/21  9:14 PM  Result Value Ref Range   Color, Urine YELLOW YELLOW   APPearance HAZY (A) CLEAR   Specific Gravity, Urine 1.017 1.005 - 1.030   pH 5.0 5.0 - 8.0   Glucose, UA NEGATIVE NEGATIVE mg/dL   Hgb urine dipstick MODERATE (A) NEGATIVE   Bilirubin Urine NEGATIVE NEGATIVE   Ketones, ur NEGATIVE NEGATIVE mg/dL   Protein, ur NEGATIVE NEGATIVE mg/dL   Nitrite NEGATIVE NEGATIVE   Leukocytes,Ua NEGATIVE NEGATIVE   RBC / HPF 0-5 0 - 5 RBC/hpf   WBC, UA 0-5 0 - 5 WBC/hpf   Bacteria, UA NONE SEEN NONE SEEN   Squamous Epithelial / LPF 0-5 0 - 5   Mucus PRESENT   CBC     Status: Abnormal   Collection Time: 02/14/21  9:54 PM  Result Value Ref Range   WBC 7.4 4.0 - 10.5 K/uL   RBC 4.12 3.87 - 5.11 MIL/uL   Hemoglobin 11.2 (L) 12.0 - 15.0 g/dL   HCT 13/12/22 (L) 19.3 - 79.0 %   MCV 83.0 80.0 - 100.0 fL   MCH 27.2 26.0 - 34.0 pg   MCHC 32.7 30.0 - 36.0 g/dL   RDW 24.0 97.3 - 53.2 %   Platelets 308 150 - 400 K/uL   nRBC 0.0 0.0 - 0.2 %     A/Positive/-- (11/07 1400)  IMAGING Bedside 11[birth year]29[birth year]1978 without visible hearttones, size not c/w LMP dates    US OB Comp Less 14 Wks  Result Date: 02/14/2021 CLINICAL DATA:  Bleeding. EXAM: OBSTETRIC <14 WK 13/03/2021 AND TRANSVAGINAL OB US TECHNIQUE: Both transabdominal and transvaginal ultrasound examinations were performed for complete evaluation of the gestation as well as the maternal uterus, adnexal regions, and pelvic cul-de-sac. Transvaginal technique was performed to assess early pregnancy. COMPARISON:  None. FINDINGS: Intrauterine gestational sac: Single Yolk sac:  Not Visualized. Embryo:  Visualized. Cardiac Activity: Not Visualized. CRL:  11.5 mm   7 w   2 d  Subchorionic hemorrhage:  None visualized. Maternal uterus/adnexae: Bilateral ovaries appear within normal limits. Corpus luteum noted in the right ovary. No free fluid in the pelvis. IMPRESSION: 1. Findings meet definitive criteria for failed intrauterine pregnancy measuring 7 weeks 2 days by crown-rump length. This follows SRU consensus guidelines: Diagnostic Criteria for Nonviable Pregnancy Early in the First Trimester. [place] J Med [id number]. Electronically Signed   By: [id number] M.D.   On: 02/14/2021 23:00   MAU Management/MDM: Orders Placed This Encounter  Procedures   13/03/2021 OB Comp Less 14 Wks   Urinalysis, Routine w reflex microscopic Urine, Clean Catch   CBC   Discharge patient    Meds ordered this encounter  Medications  misoprostol (CYTOTEC) 200 MCG tablet    Sig: Take 3 tablets (600 mcg total) by mouth once for 1 dose. Place all 3 tablets in your cheek and let them soften, then swallow.    Dispense:  3 tablet    Refill:  0    Order Specific Question:   Supervising Provider    Answer:   Jaynie Collins A [[id number]]   traMADol (ULTRAM) 50 MG tablet    Sig: Take 1-2 tablets (50-100 mg total) by mouth every 6 (six) hours as needed.    Dispense:  10 tablet    Refill:  0    Order Specific Question:   Supervising Provider    Answer:   Jaynie Collins A [[id number]]   promethazine (PHENERGAN) 12.5 MG tablet    Sig: Take 1 tablet (12.5 mg total) by mouth every 6 (six) hours as needed for nausea or vomiting.    Dispense:  30 tablet    Refill:  0    Order Specific Question:   Supervising Provider    Answer:   Jaynie Collins A [[id number]]    [place] today with definitive findings for missed abortion.  Discussed results with pt using Arabic video interpreter.  Questions answered.  Discussed options with patient including expectant management, Cytotec Rx, or D&C scheduled in 2-3 days to 1 week.  Benefits/risks discussed for all options.  All options include close follow up in the office.  Pt  chooses expectant management but will take Rx for Cytotec if no additional bleeding occurs in 1-2 weeks.  Hgb stable at 11.2, pt with support at home and husband can drive her to the hospital in emergencies.  Rx for Cytotec, Phenergan, Tramadol PRN.  Message sent to [facility] office to scheduled follow up visits. Miscarriage/bleeding precautions reviewed.    ASSESSMENT 1. Supervision of other normal pregnancy, antepartum   2. Vaginal bleeding in pregnancy, first trimester   3. Blood type, Rh positive   4. Incomplete miscarriage     PLAN Discharge home Allergies as of 02/14/2021       Reactions   Banana Other (See Comments)   Stomach upset.   Penicillin G Itching   Tomato Nausea And Vomiting        Medication List     TAKE these medications    fluticasone 50 MCG/ACT nasal spray Commonly known as: FLONASE Place 1 spray into both nostrils daily. What changed:  when to take this reasons to take this   misoprostol 200 MCG tablet Commonly known as: Cytotec Take 3 tablets (600 mcg total) by mouth once for 1 dose. Place all 3 tablets in your cheek and let them soften, then swallow.   ondansetron 4 MG tablet Commonly known as: Zofran Take 1 tablet (4 mg total) by mouth every 8 (eight) hours as needed for nausea or vomiting.   promethazine 25 MG tablet Commonly known as: PHENERGAN Take 1 tablet (25 mg total) by mouth every 6 (six) hours as needed for nausea or vomiting. What changed: Another medication with the same name was added. Make sure you understand how and when to take each.   promethazine 12.5 MG tablet Commonly known as: PHENERGAN Take 1 tablet (12.5 mg total) by mouth every 6 (six) hours as needed for nausea or vomiting. What changed: You were already taking a medication with the same name, and this prescription was added. Make sure you understand how and when to take each.   topiramate 50 MG tablet Commonly known as: TOPAMAX  Take 3 tablets (150 mg total) by mouth  at bedtime. Please follow instructions provided separately in writing   traMADol 50 MG tablet Commonly known as: ULTRAM Take 1-2 tablets (50-100 mg total) by mouth every 6 (six) hours as needed.        Follow-up Information     [facility] Follow up.   Specialty: Obstetrics and Gynecology Why: The clinic will call you with appointment for labs in 1 week, visit with provider in 2 weeks. Contact information: [address] [place] [address] [phone]                Sharen Counter Certified Nurse-Midwife 02/14/2021  11:52 PM

## 2021-02-17 ENCOUNTER — Inpatient Hospital Stay (HOSPITAL_COMMUNITY): Payer: Medicaid Other

## 2021-02-17 ENCOUNTER — Other Ambulatory Visit: Payer: Self-pay

## 2021-02-17 ENCOUNTER — Encounter (HOSPITAL_COMMUNITY): Payer: Self-pay | Admitting: Obstetrics and Gynecology

## 2021-02-17 ENCOUNTER — Inpatient Hospital Stay (HOSPITAL_COMMUNITY): Payer: Medicaid Other | Admitting: Anesthesiology

## 2021-02-17 ENCOUNTER — Inpatient Hospital Stay (HOSPITAL_COMMUNITY)
Admission: AD | Admit: 2021-02-17 | Discharge: 2021-02-17 | Disposition: A | Discharge status: Home / Self Care | Payer: Medicaid Other | Attending: Obstetrics and Gynecology | Admitting: Obstetrics and Gynecology

## 2021-02-17 ENCOUNTER — Encounter (HOSPITAL_COMMUNITY)
Admission: AD | Disposition: A | Discharge status: Home / Self Care | Payer: Self-pay | Source: Home / Self Care | Attending: Obstetrics and Gynecology

## 2021-02-17 DIAGNOSIS — O021 Missed abortion: Secondary | ICD-10-CM

## 2021-02-17 DIAGNOSIS — O034 Incomplete spontaneous abortion without complication: Secondary | ICD-10-CM | POA: Diagnosis present

## 2021-02-17 DIAGNOSIS — O209 Hemorrhage in early pregnancy, unspecified: Secondary | ICD-10-CM

## 2021-02-17 HISTORY — PX: DILATION AND EVACUATION: SHX1459

## 2021-02-17 LAB — CBC
HCT: 29.8 % — ABNORMAL LOW (ref 36.0–46.0)
Hemoglobin: 9.6 g/dL — ABNORMAL LOW (ref 12.0–15.0)
MCH: 27.4 pg (ref 26.0–34.0)
MCHC: 32.2 g/dL (ref 30.0–36.0)
MCV: 84.9 fL (ref 80.0–100.0)
Platelets: 280 10*3/uL (ref 150–400)
RBC: 3.51 MIL/uL — ABNORMAL LOW (ref 3.87–5.11)
RDW: 13.5 % (ref 11.5–15.5)
WBC: 7.6 10*3/uL (ref 4.0–10.5)
nRBC: 0 % (ref 0.0–0.2)

## 2021-02-17 LAB — TYPE AND SCREEN
ABO/RH(D): A POS
Antibody Screen: NEGATIVE

## 2021-02-17 LAB — POCT I-STAT, CHEM 8
BUN: 4 mg/dL — ABNORMAL LOW (ref 6–20)
Calcium, Ion: 1.07 mmol/L — ABNORMAL LOW (ref 1.15–1.40)
Chloride: 105 mmol/L (ref 98–111)
Creatinine, Ser: 0.5 mg/dL (ref 0.44–1.00)
Glucose, Bld: 109 mg/dL — ABNORMAL HIGH (ref 70–99)
HCT: 25 % — ABNORMAL LOW (ref 36.0–46.0)
Hemoglobin: 8.5 g/dL — ABNORMAL LOW (ref 12.0–15.0)
Potassium: 3.2 mmol/L — ABNORMAL LOW (ref 3.5–5.1)
Sodium: 139 mmol/L (ref 135–145)
TCO2: 23 mmol/L (ref 22–32)

## 2021-02-17 SURGERY — DILATION AND EVACUATION, UTERUS
Anesthesia: General | Site: Uterus

## 2021-02-17 MED ORDER — DOXYCYCLINE HYCLATE 100 MG IV SOLR
200.0000 mg | INTRAVENOUS | Status: AC
  Administered 2021-02-17: 200 mg via INTRAVENOUS
  Filled 2021-02-17: qty 200

## 2021-02-17 MED ORDER — AMISULPRIDE (ANTIEMETIC) 5 MG/2ML IV SOLN: 10.0000 mg | Freq: Once | INTRAVENOUS | Status: DC | PRN

## 2021-02-17 MED ORDER — OXYCODONE HCL 5 MG/5ML PO SOLN: 5.0000 mg | Freq: Once | ORAL | Status: DC | PRN

## 2021-02-17 MED ORDER — ONDANSETRON HCL 4 MG/2ML IJ SOLN
INTRAMUSCULAR | Status: DC | PRN
  Administered 2021-02-17: 4 mg via INTRAVENOUS

## 2021-02-17 MED ORDER — ROCURONIUM BROMIDE 10 MG/ML (PF) SYRINGE
PREFILLED_SYRINGE | INTRAVENOUS | Status: AC
  Filled 2021-02-17: qty 10

## 2021-02-17 MED ORDER — MIDAZOLAM HCL 2 MG/2ML IJ SOLN
INTRAMUSCULAR | Status: AC
  Filled 2021-02-17: qty 2

## 2021-02-17 MED ORDER — OXYCODONE HCL 5 MG PO TABS: 5.0000 mg | ORAL_TABLET | Freq: Once | ORAL | Status: DC | PRN

## 2021-02-17 MED ORDER — ACETAMINOPHEN 500 MG PO TABS: 1000.0000 mg | ORAL_TABLET | Freq: Once | ORAL | Status: DC

## 2021-02-17 MED ORDER — CHLORHEXIDINE GLUCONATE 0.12 % MT SOLN
OROMUCOSAL | Status: AC
  Administered 2021-02-17: 15 mL via OROMUCOSAL
  Filled 2021-02-17: qty 15

## 2021-02-17 MED ORDER — KETOROLAC TROMETHAMINE 30 MG/ML IJ SOLN: 30.0000 mg | Freq: Once | INTRAMUSCULAR | Status: DC | PRN

## 2021-02-17 MED ORDER — LIDOCAINE 2% (20 MG/ML) 5 ML SYRINGE
INTRAMUSCULAR | Status: DC | PRN
  Administered 2021-02-17: 20 mg via INTRAVENOUS
  Administered 2021-02-17: 130 mg via INTRAVENOUS

## 2021-02-17 MED ORDER — CHLORHEXIDINE GLUCONATE 0.12 % MT SOLN: 15.0000 mL | Freq: Once | OROMUCOSAL | Status: AC

## 2021-02-17 MED ORDER — LACTATED RINGERS IV SOLN: INTRAVENOUS | Status: DC

## 2021-02-17 MED ORDER — ACETAMINOPHEN 325 MG PO TABS
650.0000 mg | ORAL_TABLET | Freq: Once | ORAL | Status: AC
  Administered 2021-02-17: 650 mg via ORAL
  Filled 2021-02-17: qty 2

## 2021-02-17 MED ORDER — BUPIVACAINE HCL 0.5 % IJ SOLN
INTRAMUSCULAR | Status: AC
  Filled 2021-02-17: qty 1

## 2021-02-17 MED ORDER — HYDROMORPHONE HCL 1 MG/ML IJ SOLN: 0.2500 mg | INTRAMUSCULAR | Status: DC | PRN

## 2021-02-17 MED ORDER — IBUPROFEN 600 MG PO TABS: 600.0000 mg | ORAL_TABLET | Freq: Four times a day (QID) | ORAL | 3 refills | Status: AC | PRN

## 2021-02-17 MED ORDER — PROPOFOL 10 MG/ML IV BOLUS
INTRAVENOUS | Status: AC
  Filled 2021-02-17: qty 20

## 2021-02-17 MED ORDER — BUPIVACAINE HCL (PF) 0.5 % IJ SOLN
INTRAMUSCULAR | Status: DC | PRN
  Administered 2021-02-17: 10 mL

## 2021-02-17 MED ORDER — LACTATED RINGERS IV BOLUS
1000.0000 mL | Freq: Once | INTRAVENOUS | Status: AC
  Administered 2021-02-17: 1000 mL via INTRAVENOUS

## 2021-02-17 MED ORDER — PROPOFOL 10 MG/ML IV BOLUS
INTRAVENOUS | Status: DC | PRN
  Administered 2021-02-17: 20 mg via INTRAVENOUS
  Administered 2021-02-17: 130 mg via INTRAVENOUS

## 2021-02-17 MED ORDER — FENTANYL CITRATE (PF) 250 MCG/5ML IJ SOLN
INTRAMUSCULAR | Status: AC
  Filled 2021-02-17: qty 5

## 2021-02-17 MED ORDER — 0.9 % SODIUM CHLORIDE (POUR BTL) OPTIME
TOPICAL | Status: DC | PRN
  Administered 2021-02-17: 500 mL

## 2021-02-17 MED ORDER — ORAL CARE MOUTH RINSE: 15.0000 mL | Freq: Once | OROMUCOSAL | Status: AC

## 2021-02-17 MED ORDER — SUCCINYLCHOLINE CHLORIDE 200 MG/10ML IV SOSY
PREFILLED_SYRINGE | INTRAVENOUS | Status: DC | PRN
  Administered 2021-02-17: 120 mg via INTRAVENOUS

## 2021-02-17 MED ORDER — MEPERIDINE HCL 25 MG/ML IJ SOLN: 6.2500 mg | INTRAMUSCULAR | Status: DC | PRN

## 2021-02-17 MED ORDER — ONDANSETRON HCL 4 MG/2ML IJ SOLN
INTRAMUSCULAR | Status: AC
  Filled 2021-02-17: qty 2

## 2021-02-17 MED ORDER — MIDAZOLAM HCL 2 MG/2ML IJ SOLN
INTRAMUSCULAR | Status: DC | PRN
Start: 2021-02-17 — End: 2021-02-17
  Administered 2021-02-17 (×2): 2 mg via INTRAVENOUS

## 2021-02-17 MED ORDER — FENTANYL CITRATE (PF) 100 MCG/2ML IJ SOLN
INTRAMUSCULAR | Status: DC | PRN
  Administered 2021-02-17 (×3): 50 ug via INTRAVENOUS

## 2021-02-17 MED ORDER — DEXAMETHASONE SODIUM PHOSPHATE 10 MG/ML IJ SOLN
INTRAMUSCULAR | Status: AC
  Filled 2021-02-17: qty 1

## 2021-02-17 MED ORDER — DEXAMETHASONE SODIUM PHOSPHATE 10 MG/ML IJ SOLN
INTRAMUSCULAR | Status: DC | PRN
  Administered 2021-02-17: 8 mg via INTRAVENOUS

## 2021-02-17 MED ORDER — PROMETHAZINE HCL 25 MG/ML IJ SOLN: 6.2500 mg | INTRAMUSCULAR | Status: DC | PRN

## 2021-02-17 SURGICAL SUPPLY — 14 items
CATH ROBINSON RED A/P 16FR (CATHETERS) ×4 IMPLANT
CNTNR URN SCR LID CUP LEK RST (MISCELLANEOUS) ×2 IMPLANT
CONT SPEC 4OZ STRL OR WHT (MISCELLANEOUS) ×2
GLOVE SURG ENC MOIS LTX SZ6.5 (GLOVE) ×4 IMPLANT
GLOVE SURG UNDER POLY LF SZ7 (GLOVE) ×8 IMPLANT
GOWN STRL REUS W/ TWL LRG LVL3 (GOWN DISPOSABLE) ×4 IMPLANT
GOWN STRL REUS W/TWL LRG LVL3 (GOWN DISPOSABLE) ×4
KIT TURNOVER KIT B (KITS) ×4 IMPLANT
NS IRRIG 1000ML POUR BTL (IV SOLUTION) ×4 IMPLANT
PACK VAGINAL MINOR WOMEN LF (CUSTOM PROCEDURE TRAY) ×4 IMPLANT
PAD OB MATERNITY 4.3X12.25 (PERSONAL CARE ITEMS) ×4 IMPLANT
TOWEL GREEN STERILE FF (TOWEL DISPOSABLE) ×8 IMPLANT
UNDERPAD 30X36 HEAVY ABSORB (UNDERPADS AND DIAPERS) ×4 IMPLANT
VACURETTE 12 RIGID CVD (CANNULA) ×3 IMPLANT

## 2021-02-17 NOTE — Anesthesia Postprocedure Evaluation (Signed)
Anesthesia Post Note  Patient: Kristen Shaw  Procedure(s) Performed: SUCTION DILATATION AND EVACUATIOM (Uterus)     Patient location during evaluation: PACU Anesthesia Type: General Level of consciousness: awake and alert, oriented and patient cooperative Pain management: pain level controlled Vital Signs Assessment: post-procedure vital signs reviewed and stable Respiratory status: spontaneous breathing, nonlabored ventilation and respiratory function stable Cardiovascular status: blood pressure returned to baseline and stable Postop Assessment: no apparent nausea or vomiting Anesthetic complications: no   No notable events documented.  Last Vitals:  Vitals:   02/17/21 1254 02/17/21 1309  BP: 110/82 105/79  Pulse: 95 79  Resp: (!) 26 (!) 22  Temp:  (!) 36.4 C  SpO2: 100% 100%    Last Pain:  Vitals:   02/17/21 1309  TempSrc:   PainSc: 0-No pain                 Lannie Fields

## 2021-02-17 NOTE — Op Note (Signed)
Kristen Shaw PROCEDURE DATE: 02/17/2021  PREOPERATIVE DIAGNOSIS: 7.2 week missed abortion POSTOPERATIVE DIAGNOSIS: The same PROCEDURE:     Dilation and Evacuation SURGEON:  Scheryl Darter MD  INDICATIONS: 40 y.o. [id number] with IAB at 7.[redacted]  weeks gestation, needing surgical completion.  Risks of surgery were discussed with the patient including but not limited to: bleeding which may require transfusion; infection which may require antibiotics; injury to uterus or surrounding organs; need for additional procedures including laparotomy or laparoscopy; possibility of intrauterine scarring which may impair future fertility; and other postoperative/anesthesia complications. Written informed consent was obtained.    FINDINGS:  A 8 week size uterus, moderate amounts of products of conception, specimen sent to pathology.  ANESTHESIA:    Monitored intravenous sedation, paracervical block. INTRAVENOUS FLUIDS:  500 ml of LR ESTIMATED BLOOD LOSS:  Less than 20 ml. SPECIMENS:  Products of conception sent to pathology COMPLICATIONS:  None immediate.  PROCEDURE DETAILS:  The patient received intravenous Doxycycline while in the preoperative area.  She was then taken to the operating room where monitored intravenous sedation was administered and was found to be adequate.  After an adequate timeout was performed, she was placed in the dorsal lithotomy position and examined; then prepped and draped in the sterile manner.   Her bladder was catheterized for an unmeasured amount of clear, yellow urine. A vaginal speculum was then placed in the patient's vagina and a single tooth tenaculum was applied to the anterior lip of the cervix.  A paracervical block using 10 ml of 0.5% Marcaine was administered. The cervix was gently dilated to accommodate a 10 mm suction curette that was gently advanced to the uterine fundus.  The suction device was then activated and curette slowly rotated to clear the uterus of products of  conception. There was minimal bleeding noted and the tenaculum removed with good hemostasis noted.   All instruments were removed from the patient's vagina.  Sponge and instrument counts were correct times two  The patient tolerated the procedure well and was taken to the recovery area awake, and in stable condition.   Adam Phenix, MD 02/17/2021 12:35 PM

## 2021-02-17 NOTE — MAU Note (Signed)
RT AC IV, converted to saline lock after liter infused , flushed.  Site is clean and dry, clear dressing intact.  Left AC IV infusing, site is clean and dry, clear dressing is intact. Plan and time frame explained.

## 2021-02-17 NOTE — MAU Note (Signed)
Pt brought by EMS. Dizziness, did not fall, reports lower abdominal pain that is cramp-like. Reports she began bleeding 4 days ago on Friday, the heavy bleeding began today.   Interpretor services used, patient only nods. Provider Tyler Aas, CNM at bedside.

## 2021-02-17 NOTE — Anesthesia Preprocedure Evaluation (Addendum)
Anesthesia Evaluation  Patient identified by MRN, date of birth, ID band Patient awake    Reviewed: Allergy & Precautions, NPO status , Patient's Chart, lab work & pertinent test results  Airway Mallampati: II  TM Distance: >3 FB Neck ROM: Full    Dental no notable dental hx.    Pulmonary neg pulmonary ROS,    Pulmonary exam normal breath sounds clear to auscultation       Cardiovascular negative cardio ROS Normal cardiovascular exam Rhythm:Regular Rate:Normal     Neuro/Psych  Headaches, negative psych ROS   GI/Hepatic negative GI ROS, Neg liver ROS,   Endo/Other  negative endocrine ROS  Renal/GU negative Renal ROS  negative genitourinary   Musculoskeletal negative musculoskeletal ROS (+)   Abdominal   Peds  Hematology negative hematology ROS (+)   Anesthesia Other Findings seen on 02/14/21 and diagnosed with a failed pregnancy ([redacted]w[redacted]d embryo with no FHT), given her options and she decided on expectant management. The MAU provider also prescribed cytotec, but the pt never took it as it was never ready at the pharmacy. On Friday evening, the pt began light bleeding that continued through the weekend until Monday morning when it got much heavier. She passed several clots and bled heavily all day. Overnight around 3am, she called out from the bathroom c/o dizziness and then passed out - FOB says she stopped breathing, he gave her 3 rescue breaths and she regained consciousness. He called EMS. EMS reported she was minimally conscious and only responding to painful stimuli for the transfer to [facility].   Reproductive/Obstetrics Retained products of conception                           Anesthesia Physical Anesthesia Plan  ASA: 3  Anesthesia Plan: General   Post-op Pain Management:    Induction: Intravenous  PONV Risk Score and Plan: 3 and Ondansetron, Dexamethasone, Midazolam and Treatment may vary due  to age or medical condition  Airway Management Planned: LMA and Oral ETT  Additional Equipment: None  Intra-op Plan:   Post-operative Plan: Extubation in OR  Informed Consent: I have reviewed the patients History and Physical, chart, labs and discussed the procedure including the risks, benefits and alternatives for the proposed anesthesia with the patient or authorized representative who has indicated his/her understanding and acceptance.     Dental advisory given  Plan Discussed with: CRNA  Anesthesia Plan Comments:         Anesthesia Quick Evaluation

## 2021-02-17 NOTE — MAU Note (Signed)
Spoke with April in short stay.  Pt is for d/c today, "outpt" procedure, confirmed does NOT need to be covid tested.

## 2021-02-17 NOTE — Transfer of Care (Signed)
Immediate Anesthesia Transfer of Care Note  Patient: Kristen Shaw  Procedure(s) Performed: SUCTION DILATATION AND EVACUATIOM (Uterus)  Patient Location: PACU  Anesthesia Type:General  Level of Consciousness: awake and oriented  Airway & Oxygen Therapy: Patient Spontanous Breathing  Post-op Assessment: Report given to RN  Post vital signs: Reviewed and stable  Last Vitals:  Vitals Value Taken Time  BP 111/76   Temp    Pulse 106 02/17/21 1239  Resp 17 02/17/21 1239  SpO2 100 % 02/17/21 1239  Vitals shown include unvalidated device data.  Last Pain:  Vitals:   02/17/21 1129  TempSrc: Oral  PainSc: 0-No pain         Complications: No notable events documented.

## 2021-02-17 NOTE — Anesthesia Procedure Notes (Signed)
Procedure Name: Intubation Date/Time: 02/17/2021 12:10 PM Performed by: Barrington Ellison, CRNA Pre-anesthesia Checklist: Patient identified, Emergency Drugs available, Suction available and Patient being monitored Patient Re-evaluated:Patient Re-evaluated prior to induction Oxygen Delivery Method: Circle System Utilized Preoxygenation: Pre-oxygenation with 100% oxygen Induction Type: IV induction, Rapid sequence and Cricoid Pressure applied Ventilation: Mask ventilation without difficulty Laryngoscope Size: Mac and 3 Tube type: Oral Tube size: 7.0 mm Number of attempts: 1 Airway Equipment and Method: Stylet and Oral airway Placement Confirmation: ETT inserted through vocal cords under direct vision, positive ETCO2 and breath sounds checked- equal and bilateral Secured at: 21 cm Tube secured with: Tape Dental Injury: Teeth and Oropharynx as per pre-operative assessment

## 2021-02-17 NOTE — Interval H&P Note (Signed)
History and Physical Interval Note:  02/17/2021 11:57 AM  Kristen Shaw  has presented today for surgery, with the diagnosis of retained products.  The various methods of treatment have been discussed with the patient and family. After consideration of risks, benefits and other options for treatment, the patient has consented to  Procedure(s): SUCTION DILATATION AND EVACUATIOM (N/A) as a surgical intervention.  The patient's history has been reviewed, patient examined, no change in status, stable for surgery.  I have reviewed the patient's chart and labs.  Questions were answered to the patient's satisfaction.     Scheryl Darter

## 2021-02-17 NOTE — H&P (Signed)
Chief Complaint:  Vaginal Bleeding  HPI: Kristen Shaw is a 40 y.o. G5P4004 at [redacted]w[redacted]d with a confirmed SAB who presents to maternity admissions reporting excessive vaginal bleeding, severe dizziness and loss of consciousness. She was seen on 02/14/21 and diagnosed with a failed pregnancy ([redacted]w[redacted]d embryo with no FHT), given her options and she decided on expectant management. The MAU provider also prescribed cytotec, but the pt never took it as it was never ready at the pharmacy. On Friday evening, the pt began light bleeding that continued through the weekend until Monday morning when it got much heavier. She passed several clots and bled heavily all day. Overnight around 3am, she called out from the bathroom c/o dizziness and then passed out - FOB says she stopped breathing, he gave her 3 rescue breaths and she regained consciousness. He called EMS. EMS reported she was minimally conscious and only responding to painful stimuli for the transfer to [facility].   Pregnancy Course: Receives care at [facility]. Prenatal records fully reviewed.  Past Medical History:  Diagnosis Date   Choledocholithiasis with acute cholecystitis    Migraines    OB History  Gravida Para Term Preterm AB Living  5 4 4     4   SAB IAB Ectopic Multiple Live Births          4    # Outcome Date GA Lbr Len/2nd Weight Sex Delivery Anes PTL Lv  5 Current           4 Term 01/03/13 [redacted]w[redacted]d 20:00 / 00:34 7 lb 9.2 oz (3.436 kg) F Vag-Spont Local  LIV     Birth Comments: Girl Kimra Caponigro is a 7 lb 9.2 oz (3436 g) female infant born at Gestational Age: [redacted]w[redacted]d.   Prenatal & Delivery Information Mother, Montaya Paulino , is a 13 y.o.  [phone] .   Prenatal labs ABO/Rh A/POS/-- (04/09 1035)   Antibody NEG (04/09 1035)   Rubella 6.78 (04/09 1035)   RPR NON REACTIVE (10/01 1321)   HBsAG NEGATIVE (04/09 1035)   HIV NON REACTIVE (09/10 1128)   GBS Negative (09/10 0000)       Prenatal care: late at 15 wks.   Pregnancy  complications: Breech s/p version   Delivery complications: None Date & time of delivery: 01/03/2013, 9:34 PM Route of delivery: Vaginal, Spontaneous Delivery. Apgar scores: 9 at 1 minute, 9 at 5 minutes. ROM: 01/03/2013, 12:25 Pm, Spontaneous, Light Meconium.  9 hours prior to delivery Maternal antibiotics: None     Nursery Course past 24 hours:  Doing well. VSS, breast feeding X 9  (LATCH 10), 4 voids and  and 4 stools.  Family ready for discharge today, discharge instructions done using Arabic interpreter via phone, family had no concerns.     Screening Tests, Labs & Immunizations: Infant Blood Type:  Not indicated   Infant DAT:  Not indicated   HepB vaccine: Given on 01/03/2013 Newborn screen: DRAWN BY RN  (10/02 2345) Hearing Screen: Right Ear: Pass (10/03 0425)           Left Ear: Pass (10/03 0425) Transcutaneous bilirubin: 4.9 /25 hours (10/02 2318), risk zoneLow. Risk factors for jaundice:None Congenital Heart Screening:    Age at Inititial Screening: 25 hours Initial Screening Pulse 02 saturation of RIGHT hand: 100 % Pulse 02 saturation of Foot: 100 % Difference (right hand - foot): 0 % Pass / Fail: Pass      Feeding: Formula Feed for Exclusion:   No  3 Term 09/12/09 [redacted]w[redacted]d   M Vag-Spont None N LIV  2 Term 09/05/07 [redacted]w[redacted]d   F Vag-Spont None N LIV  1 Term 12/02/04 [redacted]w[redacted]d   F Vag-Spont None N LIV   History reviewed. No pertinent surgical history. Family History  Problem Relation Age of Onset   Heart disease Neg Hx    Social History   Tobacco Use   Smoking status: Never   Smokeless tobacco: Never  Vaping Use   Vaping Use: Never used  Substance Use Topics   Alcohol use: No   Drug use: No   Allergies  Allergen Reactions   Banana Other (See Comments)    Stomach upset.   Penicillin G Itching   Tomato Nausea And Vomiting   Medications Prior to Admission  Medication Sig Dispense Refill Last Dose   ondansetron (ZOFRAN) 4 MG tablet Take 1 tablet (4 mg total)  by mouth every 8 (eight) hours as needed for nausea or vomiting. 30 tablet 0 02/16/2021   promethazine (PHENERGAN) 12.5 MG tablet Take 1 tablet (12.5 mg total) by mouth every 6 (six) hours as needed for nausea or vomiting. 30 tablet 0 02/16/2021   fluticasone (FLONASE) 50 MCG/ACT nasal spray Place 1 spray into both nostrils daily. (Patient taking differently: Place 1 spray into both nostrils daily as needed for allergies or rhinitis.) 16 g 2    misoprostol (CYTOTEC) 200 MCG tablet Take 3 tablets (600 mcg total) by mouth once for 1 dose. Place all 3 tablets in your cheek and let them soften, then swallow. 3 tablet 0    promethazine (PHENERGAN) 25 MG tablet Take 1 tablet (25 mg total) by mouth every 6 (six) hours as needed for nausea or vomiting. 30 tablet 0    topiramate (TOPAMAX) 50 MG tablet Take 3 tablets (150 mg total) by mouth at bedtime. Please follow instructions provided separately in writing (Patient not taking: Reported on 02/09/2021) 90 tablet 3    traMADol (ULTRAM) 50 MG tablet Take 1-2 tablets (50-100 mg total) by mouth every 6 (six) hours as needed. 10 tablet 0    I have reviewed patient's Past Medical Hx, Surgical Hx, Family Hx, Social Hx, medications and allergies.   ROS:  Review of Systems  Constitutional:  Negative for fatigue and fever.  HENT: Negative.    Eyes:  Negative for photophobia and visual disturbance.  Respiratory:  Negative for shortness of breath.   Cardiovascular: Negative.   Gastrointestinal:  Positive for abdominal pain and nausea. Negative for vomiting.  Genitourinary:  Positive for vaginal bleeding.  Neurological:  Positive for dizziness, syncope, weakness, light-headedness and headaches.   Physical Exam  Patient Vitals for the past 24 hrs:  BP Pulse SpO2  02/17/21 0645 -- -- 100 %  02/17/21 0640 101/66 78 98 %  02/17/21 0635 -- -- 100 %  02/17/21 0631 100/67 87 --  02/17/21 0630 -- -- 99 %  02/17/21 0625 -- -- 100 %  02/17/21 0621 102/68 (!) 109 --   02/17/21 0615 -- -- 99 %  02/17/21 0614 -- -- 99 %  02/17/21 0610 (!) 93/56 75 100 %  02/17/21 0605 -- -- 97 %  02/17/21 0603 (!) 61/38 75 --  02/17/21 0555 (!) 54/27 75 90 %  02/17/21 0550 -- -- 100 %  02/17/21 0545 -- -- 100 %  02/17/21 0541 100/67 93 --  02/17/21 0540 -- -- 100 %   Physical Exam Vitals and nursing note reviewed. Exam conducted with a chaperone present.  Constitutional:      General: She is in acute distress (having trouble keeping her eyes open, initially only responsive to painful stimuli and procedures).     Appearance: She is well-developed and normal weight. She is ill-appearing.  HENT:     Head: Normocephalic.  Eyes:     Pupils: Pupils are equal, round, and reactive to light.  Cardiovascular:     Rate and Rhythm: Normal rate.     Pulses: Normal pulses.  Pulmonary:     Effort: Pulmonary effort is normal.  Abdominal:     Palpations: Abdomen is soft.     Tenderness: There is abdominal tenderness (over uterus).  Genitourinary:    Cervix: Cervical bleeding (large clot evacuated from vagina and cervix, caused vagal response in pt) present.  Musculoskeletal:        General: Normal range of motion.  Skin:    General: Skin is dry.     Comments: Cold extremities  Neurological:     Mental Status: She is lethargic and disoriented.     Motor: Weakness present.  Psychiatric:        Mood and Affect: Mood normal.        Behavior: Behavior normal.        Thought Content: Thought content normal.        Judgment: Judgment normal.    Labs: Results for orders placed or performed during the hospital encounter of 02/17/21 (from the past 24 hour(s))  CBC     Status: Abnormal   Collection Time: 02/17/21  6:00 AM  Result Value Ref Range   WBC 7.6 4.0 - 10.5 K/uL   RBC 3.51 (L) 3.87 - 5.11 MIL/uL   Hemoglobin 9.6 (L) 12.0 - 15.0 g/dL   HCT 78.2 (L) 42.3 - 53.6 %   MCV 84.9 80.0 - 100.0 fL   MCH 27.4 26.0 - 34.0 pg   MCHC 32.2 30.0 - 36.0 g/dL   RDW 14.4 31.5 -  40.0 %   Platelets 280 150 - 400 K/uL   nRBC 0.0 0.0 - 0.2 %  Type and screen     Status: None (Preliminary result)   Collection Time: 02/17/21  6:00 AM  Result Value Ref Range   ABO/RH(D) PENDING    Antibody Screen PENDING    Sample Expiration      02/20/2021,2359 Performed at [facility], [address] [place], [place] [address]    Imaging:  US OB Transvaginal  Result Date: 02/17/2021 CLINICAL DATA:  40 year old female with recent bleeding in the 1st trimester of pregnancy. Absent cardiac activity on ultrasound 3 days ago meeting definitive criteria for failed IUP. EXAM: TRANSVAGINAL OB ULTRASOUND TECHNIQUE: Transvaginal ultrasound was performed for complete evaluation of the gestation as well as the maternal uterus, adnexal regions, and pelvic cul-de-sac. COMPARISON:  02/14/2021. FINDINGS: Intrauterine gestational sac: Absent now. Heterogeneous endometrium, thickened up to 23 mm with hypervascularity (images 15 and 16). Maternal uterus/adnexae: Right ovary is 3.0 x 2.5 x 3.0 cm, stable and within normal limits. Probable corpus luteum (image 21). Left ovary is normal 2.8 x 0.9 x 1.7 cm. No pelvic free fluid. IMPRESSION: Absent gestational sac now but heterogeneous and hypervascular endometrium suspicious for retained products of conception. Electronically Signed   By: Odessa Fleming M.D.   On: 02/17/2021 06:29    MAU Course: Orders Placed This Encounter  Procedures   US OB Transvaginal   CBC   Type and screen   Meds ordered this encounter  Medications  lactated ringers bolus 1,000 mL   lactated ringers bolus 1,000 mL   MDM: Pt fairly non-responsive when she arrived to MAU but then able to answer yes/no questions to myself and RN. MD aware of pt's arrival and available in MAU if needed. LR bolus  started, able to get consent for speculum exam. Evacuated large clot from vagina and another from inside the cervix which caused a vagal response in the pt. She got very dizzy and  hypotensive. Started another IV line and hung a second bag of LR. Ordered a CBC, T&S and bedside TVUS (see results).  After TVUS completed, pt much more responsive and reported less dizziness. BP more stable in the 100s/60s. Explained to pt and FOB that she would likely need to be taken to the OR given her overall status and amount of retained products. Pt able to express understanding.  Assessment: Incomplete miscarriage   Plan: Hold in observation until OR case at 11:45am.  Continue fluid resuscitation. Care turned over to Radene Gunning, MD  Gaylan Gerold, CNM, MSN, [facility] Certified Nurse Midwife, [place]

## 2021-02-18 ENCOUNTER — Encounter (HOSPITAL_COMMUNITY): Payer: Self-pay | Admitting: Obstetrics & Gynecology

## 2021-02-18 LAB — SURGICAL PATHOLOGY

## 2021-02-19 LAB — SURGICAL PATHOLOGY

## 2021-02-20 ENCOUNTER — Encounter (HOSPITAL_COMMUNITY): Payer: Self-pay | Admitting: Obstetrics & Gynecology

## 2021-02-20 ENCOUNTER — Inpatient Hospital Stay (HOSPITAL_COMMUNITY)
Admission: AD | Admit: 2021-02-20 | Discharge: 2021-02-21 | Disposition: A | Discharge status: Home / Self Care | Payer: Medicaid Other | Attending: Obstetrics and Gynecology | Admitting: Obstetrics and Gynecology

## 2021-02-20 DIAGNOSIS — R42 Dizziness and giddiness: Secondary | ICD-10-CM | POA: Insufficient documentation

## 2021-02-20 DIAGNOSIS — D500 Iron deficiency anemia secondary to blood loss (chronic): Secondary | ICD-10-CM | POA: Diagnosis not present

## 2021-02-20 DIAGNOSIS — R1030 Lower abdominal pain, unspecified: Secondary | ICD-10-CM | POA: Diagnosis not present

## 2021-02-20 DIAGNOSIS — Z8759 Personal history of other complications of pregnancy, childbirth and the puerperium: Secondary | ICD-10-CM | POA: Diagnosis not present

## 2021-02-20 DIAGNOSIS — Z3480 Encounter for supervision of other normal pregnancy, unspecified trimester: Secondary | ICD-10-CM

## 2021-02-20 DIAGNOSIS — Z9889 Other specified postprocedural states: Secondary | ICD-10-CM

## 2021-02-20 LAB — URINALYSIS, ROUTINE W REFLEX MICROSCOPIC
Bacteria, UA: NONE SEEN
Bilirubin Urine: NEGATIVE
Glucose, UA: NEGATIVE mg/dL
Ketones, ur: NEGATIVE mg/dL
Leukocytes,Ua: NEGATIVE
Nitrite: NEGATIVE
Protein, ur: NEGATIVE mg/dL
Specific Gravity, Urine: 1.002 — ABNORMAL LOW (ref 1.005–1.030)
pH: 7 (ref 5.0–8.0)

## 2021-02-20 LAB — CBC
HCT: 26.4 % — ABNORMAL LOW (ref 36.0–46.0)
Hemoglobin: 8.6 g/dL — ABNORMAL LOW (ref 12.0–15.0)
MCH: 27.3 pg (ref 26.0–34.0)
MCHC: 32.6 g/dL (ref 30.0–36.0)
MCV: 83.8 fL (ref 80.0–100.0)
Platelets: 310 10*3/uL (ref 150–400)
RBC: 3.15 MIL/uL — ABNORMAL LOW (ref 3.87–5.11)
RDW: 13.8 % (ref 11.5–15.5)
WBC: 9.8 10*3/uL (ref 4.0–10.5)
nRBC: 0 % (ref 0.0–0.2)

## 2021-02-20 LAB — GLUCOSE, CAPILLARY: Glucose-Capillary: 110 mg/dL — ABNORMAL HIGH (ref 70–99)

## 2021-02-20 MED ORDER — IBUPROFEN 800 MG PO TABS
800.0000 mg | ORAL_TABLET | Freq: Once | ORAL | Status: AC
  Administered 2021-02-20: 800 mg via ORAL
  Filled 2021-02-20: qty 1

## 2021-02-20 NOTE — MAU Provider Note (Signed)
History     CSN: [id number]  Arrival date and time: 02/20/21 2212   Event Date/Time   First Provider Initiated Contact with Patient 02/20/21 2252      Chief Complaint  Patient presents with   Abdominal Pain   Dizziness   Kristen Shaw is a 40 y.o. [id number] s/p D&C on Nov 15th.  She presents today for Abdominal Pain and Dizziness.  She reports that she has been experiencing her symptoms since her surgery.    Patient reports intermittent lower abdominal pain that she describes as pressure.  She denies relieving or aggravating factors for the pain.  She reports she completed her last dose of misoprostol today. She states she is having little bleeding.  She rates the pain a 7/10 and states she has not taken any medication.  Patient states dizziness is also intermittent is improved with sitting and resting and worsened with movement.  She states today she has had rice, bread, soup, water, juice, and some meat.  She states that when she experiences the dizziness she also has the feeling of her heart racing.    OB History     Gravida  5   Para  4   Term  4   Preterm      AB      Living  4      SAB      IAB      Ectopic      Multiple      Live Births  4           Past Medical History:  Diagnosis Date   Choledocholithiasis with acute cholecystitis    Migraines     Past Surgical History:  Procedure Laterality Date   DILATION AND EVACUATION N/A 02/17/2021   Procedure: SUCTION DILATATION AND EVACUATIOM;  Surgeon: Adam Phenix, MD;  Location: [facility] OR;  Service: Gynecology;  Laterality: N/A;    Family History  Problem Relation Age of Onset   Heart disease Neg Hx     Social History   Tobacco Use   Smoking status: Never   Smokeless tobacco: Never  Vaping Use   Vaping Use: Never used  Substance Use Topics   Alcohol use: No   Drug use: No    Allergies:  Allergies  Allergen Reactions   Banana Other (See Comments)    Stomach upset.   Penicillin G  Itching   Tomato Nausea And Vomiting    Medications Prior to Admission  Medication Sig Dispense Refill Last Dose   misoprostol (CYTOTEC) 200 MCG tablet Take 3 tablets (600 mcg total) by mouth once for 1 dose. Place all 3 tablets in your cheek and let them soften, then swallow. 3 tablet 0 02/20/2021   fluticasone (FLONASE) 50 MCG/ACT nasal spray Place 1 spray into both nostrils daily. (Patient taking differently: Place 1 spray into both nostrils daily as needed for allergies or rhinitis.) 16 g 2    ibuprofen (ADVIL) 600 MG tablet Take 1 tablet (600 mg total) by mouth every 6 (six) hours as needed. 60 tablet 3 More than a month   ondansetron (ZOFRAN) 4 MG tablet Take 1 tablet (4 mg total) by mouth every 8 (eight) hours as needed for nausea or vomiting. 30 tablet 0 More than a month   promethazine (PHENERGAN) 12.5 MG tablet Take 1 tablet (12.5 mg total) by mouth every 6 (six) hours as needed for nausea or vomiting. 30 tablet 0 More than a month   promethazine (  PHENERGAN) 25 MG tablet Take 1 tablet (25 mg total) by mouth every 6 (six) hours as needed for nausea or vomiting. 30 tablet 0 More than a month   topiramate (TOPAMAX) 50 MG tablet Take 3 tablets (150 mg total) by mouth at bedtime. Please follow instructions provided separately in writing (Patient not taking: Reported on 02/09/2021) 90 tablet 3    traMADol (ULTRAM) 50 MG tablet Take 1-2 tablets (50-100 mg total) by mouth every 6 (six) hours as needed. 10 tablet 0 More than a month    Review of Systems  Constitutional:  Negative for chills and fever.  Gastrointestinal:  Positive for abdominal pain.  Genitourinary:  Positive for vaginal bleeding (Small).  Neurological:  Positive for dizziness. Negative for light-headedness, numbness and headaches.  Physical Exam   Blood pressure 131/89, pulse 83, temperature 98.7 F (37.1 C), temperature source Oral, resp. rate 20, last menstrual period 11/12/2020, SpO2 100 %.  Physical Exam Vitals  reviewed. Exam conducted with a chaperone present.  Constitutional:      General: She is not in acute distress.    Appearance: She is well-developed. She is not toxic-appearing.  HENT:     Head: Normocephalic and atraumatic.  Eyes:     Conjunctiva/sclera: Conjunctivae normal.  Cardiovascular:     Rate and Rhythm: Normal rate and regular rhythm.     Heart sounds: Normal heart sounds.  Pulmonary:     Effort: Pulmonary effort is normal. No respiratory distress.     Breath sounds: Normal breath sounds.  Abdominal:     Palpations: Abdomen is soft.     Tenderness: There is abdominal tenderness in the right lower quadrant and suprapubic area.  Musculoskeletal:        General: Normal range of motion.     Cervical back: Normal range of motion.  Skin:    General: Skin is warm and dry.  Neurological:     Mental Status: She is alert and oriented to person, place, and time.  Psychiatric:        Mood and Affect: Mood normal.        Behavior: Behavior normal.    MAU Course  Procedures Results for orders placed or performed during the hospital encounter of 02/20/21 (from the past 24 hour(s))  Urinalysis, Routine w reflex microscopic Urine, Clean Catch     Status: Abnormal   Collection Time: 02/20/21 10:33 PM  Result Value Ref Range   Color, Urine COLORLESS (A) YELLOW   APPearance CLEAR CLEAR   Specific Gravity, Urine 1.002 (L) 1.005 - 1.030   pH 7.0 5.0 - 8.0   Glucose, UA NEGATIVE NEGATIVE mg/dL   Hgb urine dipstick LARGE (A) NEGATIVE   Bilirubin Urine NEGATIVE NEGATIVE   Ketones, ur NEGATIVE NEGATIVE mg/dL   Protein, ur NEGATIVE NEGATIVE mg/dL   Nitrite NEGATIVE NEGATIVE   Leukocytes,Ua NEGATIVE NEGATIVE   WBC, UA 0-5 0 - 5 WBC/hpf   Bacteria, UA NONE SEEN NONE SEEN   Squamous Epithelial / LPF 0-5 0 - 5  Glucose, capillary     Status: Abnormal   Collection Time: 02/20/21 11:01 PM  Result Value Ref Range   Glucose-Capillary 110 (H) 70 - 99 mg/dL  CBC     Status: Abnormal    Collection Time: 02/20/21 11:04 PM  Result Value Ref Range   WBC 9.8 4.0 - 10.5 K/uL   RBC 3.15 (L) 3.87 - 5.11 MIL/uL   Hemoglobin 8.6 (L) 12.0 - 15.0 g/dL   HCT 29.9 (L)  36.0 - 46.0 %   MCV 83.8 80.0 - 100.0 fL   MCH 27.3 26.0 - 34.0 pg   MCHC 32.6 30.0 - 36.0 g/dL   RDW 94.7 65.4 - 65.0 %   Platelets 310 150 - 400 K/uL   nRBC 0.0 0.0 - 0.2 %    MDM Labs: CBC, CBG Exam Pain medication Assessment and Plan  40 year old Female S/P D&C Dizziness Abdominal Cramping Language Barrier  -Exam performed. -Discussed how medication causes increased cramping and should have been discontinued after procedure. -Patient offered and accepts pain medication. Will give ibuprofen 800mg  now. -Reviewed potential causes for anemia including hypoglycemia and anemia. -Discussed protein rich diet. -CBG normal at 110. Will collect CBC. -Reviewed possible iron supplementation depending on HgB level.  -[place]  [id number] 02/20/2021, 10:52 PM   Reassessment (12:04 AM) Anemia s/t blood loss  -Nurse instructed to inform patient of: *Results *Need for iron supplement. *Educate on iron rich diet. Provider to put information in AVS. *Encourage rest *Remind to follow up as scheduled. -Encouraged to call primary office or return to MAU if symptoms worsen or with the onset of new symptoms. -Discharged to home in stable condition.  02/22/2021 MSN, CNM Advanced Practice Provider, [facility]

## 2021-02-20 NOTE — MAU Note (Signed)
..  Kristen Shaw is a 40 y.o. at [redacted]w[redacted]d here in MAU reporting: Since 11am dizziness, heart racing, and lower abdominal cramping. Denies taking any medicine besides her last misoprostol that was prescribed.   Pain score: 6/10 Vitals:   02/20/21 2222  BP: 131/89  Pulse: 83  Resp: 20  Temp: 98.7 F (37.1 C)  SpO2: 100%      Lab orders placed from triage: UA

## 2021-02-21 DIAGNOSIS — D500 Iron deficiency anemia secondary to blood loss (chronic): Secondary | ICD-10-CM

## 2021-02-21 MED ORDER — FERROUS SULFATE 325 (65 FE) MG PO TBEC: 325.0000 mg | DELAYED_RELEASE_TABLET | ORAL | 1 refills | Status: DC

## 2021-02-21 NOTE — Progress Notes (Signed)
Incomplete miscarriage was the preoperative diagnosis

## 2021-02-23 ENCOUNTER — Telehealth: Payer: Self-pay | Admitting: General Practice

## 2021-02-23 ENCOUNTER — Other Ambulatory Visit: Payer: Medicaid Other

## 2021-02-23 ENCOUNTER — Other Ambulatory Visit: Payer: Self-pay | Admitting: *Deleted

## 2021-02-23 DIAGNOSIS — O034 Incomplete spontaneous abortion without complication: Secondary | ICD-10-CM

## 2021-02-23 NOTE — Telephone Encounter (Signed)
-----   Message from Adam Phenix, MD sent at 02/21/2021 10:29 AM EST ----- The surgical specimen showed no chorionic villi, patient may have passed tissue preop. Please have her do a HCG in the office next week

## 2021-02-23 NOTE — Telephone Encounter (Signed)
Called patient and informed her of results. Offered appt tomorrow at 11am. Patient verbalized understanding & states she can come then.

## 2021-02-24 ENCOUNTER — Other Ambulatory Visit: Payer: Medicaid Other

## 2021-02-24 ENCOUNTER — Other Ambulatory Visit: Payer: Self-pay

## 2021-02-24 DIAGNOSIS — O034 Incomplete spontaneous abortion without complication: Secondary | ICD-10-CM

## 2021-02-25 LAB — BETA HCG QUANT (REF LAB): hCG Quant: 50 m[IU]/mL

## 2021-03-04 ENCOUNTER — Ambulatory Visit: Payer: Medicaid Other | Admitting: Certified Nurse Midwife

## 2021-03-18 ENCOUNTER — Other Ambulatory Visit: Payer: Self-pay

## 2021-03-18 DIAGNOSIS — Z3480 Encounter for supervision of other normal pregnancy, unspecified trimester: Secondary | ICD-10-CM

## 2021-03-18 DIAGNOSIS — D563 Thalassemia minor: Secondary | ICD-10-CM

## 2021-03-20 ENCOUNTER — Encounter: Payer: Medicaid Other | Admitting: Family Medicine

## 2021-04-03 ENCOUNTER — Ambulatory Visit: Payer: Medicaid Other

## 2021-05-13 ENCOUNTER — Encounter: Payer: Self-pay | Admitting: Neurology

## 2021-05-15 ENCOUNTER — Other Ambulatory Visit: Payer: Self-pay | Admitting: Internal Medicine

## 2021-05-16 LAB — BASIC METABOLIC PANEL WITH GFR
BUN/Creatinine Ratio: 8 (calc) (ref 6–22)
BUN: 5 mg/dL — ABNORMAL LOW (ref 7–25)
CO2: 28 mmol/L (ref 20–32)
Calcium: 8.9 mg/dL (ref 8.6–10.2)
Chloride: 101 mmol/L (ref 98–110)
Creat: 0.65 mg/dL (ref 0.50–0.97)
Glucose, Bld: 73 mg/dL (ref 65–99)
Potassium: 3.9 mmol/L (ref 3.5–5.3)
Sodium: 137 mmol/L (ref 135–146)
eGFR: 115 mL/min/{1.73_m2} (ref >=60)

## 2021-05-16 LAB — CBC
HCT: 36.5 % (ref 35.0–45.0)
Hemoglobin: 11.1 g/dL — ABNORMAL LOW (ref 11.7–15.5)
MCH: 23.5 pg — ABNORMAL LOW (ref 27.0–33.0)
MCHC: 30.4 g/dL — ABNORMAL LOW (ref 32.0–36.0)
MCV: 77.2 fL — ABNORMAL LOW (ref 80.0–100.0)
MPV: 10.9 fL (ref 7.5–12.5)
Platelets: 318 10*3/uL (ref 140–400)
RBC: 4.73 10*6/uL (ref 3.80–5.10)
RDW: 15.1 % — ABNORMAL HIGH (ref 11.0–15.0)
WBC: 4.1 10*3/uL (ref 3.8–10.8)

## 2021-05-16 LAB — INFLUENZA A AND B AG, IMMUNOASSAY
INFLUENZA A ANTIGEN: NOT DETECTED
INFLUENZA B ANTIGEN: NOT DETECTED
MICRO NUMBER:: 12994454
SOURCE:: 0
SPECIMEN QUALITY:: ADEQUATE

## 2021-05-16 LAB — SARS-COV-2 RNA,(COVID-19) QUALITATIVE NAAT: SARS CoV2 RNA: DETECTED — AB

## 2021-06-10 ENCOUNTER — Emergency Department (HOSPITAL_COMMUNITY)
Admission: EM | Admit: 2021-06-10 | Discharge: 2021-06-11 | Disposition: A | Discharge status: Home / Self Care | Payer: Medicaid Other | Attending: Emergency Medicine | Admitting: Emergency Medicine

## 2021-06-10 ENCOUNTER — Encounter (HOSPITAL_COMMUNITY): Payer: Self-pay

## 2021-06-10 DIAGNOSIS — R1013 Epigastric pain: Secondary | ICD-10-CM | POA: Diagnosis present

## 2021-06-10 DIAGNOSIS — K8020 Calculus of gallbladder without cholecystitis without obstruction: Secondary | ICD-10-CM

## 2021-06-10 DIAGNOSIS — E876 Hypokalemia: Secondary | ICD-10-CM | POA: Insufficient documentation

## 2021-06-10 DIAGNOSIS — K8070 Calculus of gallbladder and bile duct without cholecystitis without obstruction: Secondary | ICD-10-CM | POA: Diagnosis not present

## 2021-06-10 DIAGNOSIS — R109 Unspecified abdominal pain: Secondary | ICD-10-CM

## 2021-06-10 DIAGNOSIS — K8050 Calculus of bile duct without cholangitis or cholecystitis without obstruction: Secondary | ICD-10-CM

## 2021-06-10 LAB — COMPREHENSIVE METABOLIC PANEL
ALT: 11 U/L (ref 0–44)
AST: 15 U/L (ref 15–41)
Albumin: 3.4 g/dL — ABNORMAL LOW (ref 3.5–5.0)
Alkaline Phosphatase: 60 U/L (ref 38–126)
Anion gap: 7 (ref 5–15)
BUN: 7 mg/dL (ref 6–20)
CO2: 26 mmol/L (ref 22–32)
Calcium: 8.9 mg/dL (ref 8.9–10.3)
Chloride: 102 mmol/L (ref 98–111)
Creatinine, Ser: 0.6 mg/dL (ref 0.44–1.00)
GFR, Estimated: >60 mL/min (ref >60)
Glucose, Bld: 102 mg/dL — ABNORMAL HIGH (ref 70–99)
Potassium: 3.1 mmol/L — ABNORMAL LOW (ref 3.5–5.1)
Sodium: 135 mmol/L (ref 135–145)
Total Bilirubin: 0.4 mg/dL (ref 0.3–1.2)
Total Protein: 7 g/dL (ref 6.5–8.1)

## 2021-06-10 LAB — CBC
HCT: 34.6 % — ABNORMAL LOW (ref 36.0–46.0)
Hemoglobin: 10.6 g/dL — ABNORMAL LOW (ref 12.0–15.0)
MCH: 23.1 pg — ABNORMAL LOW (ref 26.0–34.0)
MCHC: 30.6 g/dL (ref 30.0–36.0)
MCV: 75.4 fL — ABNORMAL LOW (ref 80.0–100.0)
Platelets: 365 10*3/uL (ref 150–400)
RBC: 4.59 MIL/uL (ref 3.87–5.11)
RDW: 18.6 % — ABNORMAL HIGH (ref 11.5–15.5)
WBC: 6.2 10*3/uL (ref 4.0–10.5)
nRBC: 0 % (ref 0.0–0.2)

## 2021-06-10 LAB — I-STAT BETA HCG BLOOD, ED (MC, WL, AP ONLY): I-stat hCG, quantitative: <5 m[IU]/mL (ref <5)

## 2021-06-10 LAB — URINALYSIS, ROUTINE W REFLEX MICROSCOPIC
Bilirubin Urine: NEGATIVE
Glucose, UA: NEGATIVE mg/dL
Hgb urine dipstick: NEGATIVE
Ketones, ur: NEGATIVE mg/dL
Leukocytes,Ua: NEGATIVE
Nitrite: NEGATIVE
Protein, ur: NEGATIVE mg/dL
Specific Gravity, Urine: 1.001 — ABNORMAL LOW (ref 1.005–1.030)
pH: 7 (ref 5.0–8.0)

## 2021-06-10 LAB — LIPASE, BLOOD: Lipase: 44 U/L (ref 11–51)

## 2021-06-10 NOTE — ED Triage Notes (Addendum)
Pt needs arabic interpreter. Pt reports right sided flank pain and upper abd pain. Pt reports the pain started x2 weeks ago .Pt reports N&V&D. ?

## 2021-06-11 ENCOUNTER — Emergency Department (HOSPITAL_COMMUNITY): Payer: Medicaid Other

## 2021-06-11 ENCOUNTER — Other Ambulatory Visit: Payer: Self-pay

## 2021-06-11 MED ORDER — POTASSIUM CHLORIDE CRYS ER 20 MEQ PO TBCR: 40.0000 meq | EXTENDED_RELEASE_TABLET | Freq: Every day | ORAL | 0 refills | Status: DC

## 2021-06-11 MED ORDER — PANTOPRAZOLE SODIUM 40 MG PO TBEC
40.0000 mg | DELAYED_RELEASE_TABLET | Freq: Every day | ORAL | Status: DC
  Administered 2021-06-11: 01:00:00 40 mg via ORAL
  Filled 2021-06-11: qty 1

## 2021-06-11 MED ORDER — ONDANSETRON 4 MG PO TBDP
8.0000 mg | ORAL_TABLET | Freq: Once | ORAL | Status: AC
  Administered 2021-06-11: 01:00:00 8 mg via ORAL
  Filled 2021-06-11: qty 2

## 2021-06-11 MED ORDER — ALUM & MAG HYDROXIDE-SIMETH 400-400-40 MG/5ML PO SUSP: 15.0000 mL | Freq: Four times a day (QID) | ORAL | 0 refills | Status: DC | PRN

## 2021-06-11 MED ORDER — POTASSIUM CHLORIDE CRYS ER 20 MEQ PO TBCR
40.0000 meq | EXTENDED_RELEASE_TABLET | Freq: Once | ORAL | Status: AC
  Administered 2021-06-11: 02:00:00 40 meq via ORAL
  Filled 2021-06-11: qty 2

## 2021-06-11 MED ORDER — PANTOPRAZOLE SODIUM 20 MG PO TBEC: 20.0000 mg | DELAYED_RELEASE_TABLET | Freq: Every day | ORAL | 0 refills | Status: DC

## 2021-06-11 MED ORDER — ALUM & MAG HYDROXIDE-SIMETH 200-200-20 MG/5ML PO SUSP
30.0000 mL | Freq: Once | ORAL | Status: AC
  Administered 2021-06-11: 01:00:00 30 mL via ORAL
  Filled 2021-06-11: qty 30

## 2021-06-11 NOTE — ED Provider Notes (Signed)
Emergency Department Provider Note  I have reviewed the triage vital signs and the nursing notes.  HISTORY  Chief Complaint Abdominal Pain   HPI Kristen Shaw is a 41 y.o. female with  h/o cholelithiasis here with right flank pain and epigastric pain often after eating. Sometimes nauseous. No diarrhea, constipation, urinary symptoms. No rash. No trauma.   PMH Past Medical History:  Diagnosis Date   Choledocholithiasis with acute cholecystitis    Migraines     Home Medications Prior to Admission medications   Medication Sig Start Date End Date Taking? Authorizing Provider  alum & mag hydroxide-simeth (MAALOX PLUS) 400-400-40 MG/5ML suspension Take 15 mLs by mouth every 6 (six) hours as needed for indigestion. 06/11/21  Yes Yordy Matton, Barbara Cower, MD  pantoprazole (PROTONIX) 20 MG tablet Take 1 tablet (20 mg total) by mouth daily for 10 days. 06/11/21 06/21/21 Yes Berneta Sconyers, Barbara Cower, MD  potassium chloride SA (KLOR-CON M) 20 MEQ tablet Take 2 tablets (40 mEq total) by mouth daily for 10 days. 06/11/21 06/21/21 Yes Makhayla Mcmurry, Barbara Cower, MD  ferrous sulfate 325 (65 FE) MG EC tablet Take 1 tablet (325 mg total) by mouth every other day. 02/21/21   Gerrit Heck, CNM  fluticasone (FLONASE) 50 MCG/ACT nasal spray Place 1 spray into both nostrils daily. Patient taking differently: Place 1 spray into both nostrils daily as needed for allergies or rhinitis. 11/30/17   Dahlia Byes A, NP  ibuprofen (ADVIL) 600 MG tablet Take 1 tablet (600 mg total) by mouth every 6 (six) hours as needed. 02/17/21   Adam Phenix, MD  ondansetron (ZOFRAN) 4 MG tablet Take 1 tablet (4 mg total) by mouth every 8 (eight) hours as needed for nausea or vomiting. 01/24/21   Rai, Delene Ruffini, MD  promethazine (PHENERGAN) 12.5 MG tablet Take 1 tablet (12.5 mg total) by mouth every 6 (six) hours as needed for nausea or vomiting. 02/14/21   Leftwich-Kirby, Wilmer Floor, CNM  promethazine (PHENERGAN) 25 MG tablet Take 1 tablet (25 mg total) by mouth  every 6 (six) hours as needed for nausea or vomiting. 02/09/21   Gerrit Heck, CNM  topiramate (TOPAMAX) 50 MG tablet Take 3 tablets (150 mg total) by mouth at bedtime. Please follow instructions provided separately in writing Patient not taking: Reported on 02/09/2021 08/26/20   Huston Foley, MD  traMADol (ULTRAM) 50 MG tablet Take 1-2 tablets (50-100 mg total) by mouth every 6 (six) hours as needed. 02/14/21   Leftwich-Kirby, Wilmer Floor, CNM    Social History Social History   Tobacco Use   Smoking status: Never   Smokeless tobacco: Never  Vaping Use   Vaping Use: Never used  Substance Use Topics   Alcohol use: No   Drug use: No    Review of Systems: Documented in HPI ____________________________________________  PHYSICAL EXAM: VITAL SIGNS: ED Triage Vitals [06/10/21 2246]  Enc Vitals Group     BP (!) 153/99     Pulse Rate 65     Resp 15     Temp 97.8 F (36.6 C)     Temp Source Oral     SpO2 100 %   Physical Exam Vitals and nursing note reviewed.  Constitutional:      Appearance: She is well-developed.  HENT:     Head: Normocephalic and atraumatic.     Mouth/Throat:     Mouth: Mucous membranes are moist.     Pharynx: Oropharynx is clear.  Eyes:     Pupils: Pupils are equal, round, and  reactive to light.  Cardiovascular:     Rate and Rhythm: Normal rate and regular rhythm.  Pulmonary:     Effort: No respiratory distress.     Breath sounds: No stridor.  Abdominal:     General: Bowel sounds are normal. There is no distension.     Tenderness: There is no abdominal tenderness.  Musculoskeletal:     Cervical back: Normal range of motion.  Skin:    General: Skin is warm and dry.  Neurological:     General: No focal deficit present.     Mental Status: She is alert.      ____________________________________________   LABS (all labs ordered are listed, but only abnormal results are displayed)  Labs Reviewed  COMPREHENSIVE METABOLIC PANEL - Abnormal; Notable for  the following components:      Result Value   Potassium 3.1 (*)    Glucose, Bld 102 (*)    Albumin 3.4 (*)    All other components within normal limits  CBC - Abnormal; Notable for the following components:   Hemoglobin 10.6 (*)    HCT 34.6 (*)    MCV 75.4 (*)    MCH 23.1 (*)    RDW 18.6 (*)    All other components within normal limits  URINALYSIS, ROUTINE W REFLEX MICROSCOPIC - Abnormal; Notable for the following components:   Color, Urine COLORLESS (*)    Specific Gravity, Urine 1.001 (*)    All other components within normal limits  LIPASE, BLOOD  I-STAT BETA HCG BLOOD, ED ([facility], [facility], [facility] ONLY)   ____________________________________________  EKG   EKG Interpretation  Date/Time:    Ventricular Rate:    PR Interval:    QRS Duration:   QT Interval:    QTC Calculation:   R Axis:     Text Interpretation:          ____________________________________________  RADIOLOGY  US Abdomen Limited RUQ (LIVER/GB)  Result Date: 06/11/2021 CLINICAL DATA:  Abdominal pain EXAM: ULTRASOUND ABDOMEN LIMITED RIGHT UPPER QUADRANT COMPARISON:  None. FINDINGS: Gallbladder: Gallbladder is partially distended and filled with multiple stones. No wall thickening or pericholecystic fluid is noted. Negative sonographic Murphy's sign is elicited. Common bile duct: Diameter: 3.2 mm. Liver: No focal lesion identified. Within normal limits in parenchymal echogenicity. Portal vein is patent on color Doppler imaging with normal direction of blood flow towards the liver. Other: None. IMPRESSION: Multiple gallstones without complicating factors. Electronically Signed   By: Alcide Clever M.D.   On: 06/11/2021 01:05   ____________________________________________  PROCEDURES  Procedure(s) performed:   Procedures ____________________________________________  INITIAL IMPRESSION / ASSESSMENT AND PLAN   This patient presents to the ED for concern of abdominal pain, this involves an extensive number of  treatment options, and is a complaint that carries with it a high risk of complications and morbidity.  The differential diagnosis includes cholecystitis, cholelithiasis, pancreatitis, pregnancy, PUD.   Additional history obtained:  Additional history obtained from noone Previous records obtained and reviewed in epic  Co morbidities that complicate the patient evaluation  obesity  Social Determinants of Health:  N/a  ED Course  Images ordered viewed and obtained by myself. Agree with Radiology interpretation. Details in ED course.  Labs ordered reviewed by myself as detailed in ED course.  Consultations obtained/considered detailed in ED course.   [place] visualized by myself without obvious signs of cholecystitis.   Lipase, LFT's, bilirubin all improved      Cardiac Monitoring:  The patient was maintained on  a cardiac monitor.  I personally viewed and interpreted the cardiac monitored which showed an underlying rhythm of: sinus rhythm  CRITICAL INTERVENTIONS:  N/a  Reevaluation:  After the interventions noted above, I reevaluated the patient and found that they have :stayed the same  FINAL IMPRESSION AND PLAN Final diagnoses:  Abdominal pain  Biliary colic  Calculus of gallbladder without cholecystitis without obstruction  Hypokalemia   Low potassium. Will supplement. Continued cholelithiasis without complications at this time. Will refer to surgery.   A medical screening exam was performed and I feel the patient has had an appropriate workup for their chief complaint at this time and likelihood of emergent condition existing is low. They have been counseled on decision, DISCHARGE, follow up and which symptoms necessitate immediate return to the emergency department. They or their family verbally stated understanding and agreement with plan and discharged in stable condition.   ____________________________________________   NEW OUTPATIENT MEDICATIONS STARTED DURING THIS  VISIT:  Discharge Medication List as of 06/11/2021  1:41 AM     START taking these medications   Details  alum & mag hydroxide-simeth (MAALOX PLUS) 400-400-40 MG/5ML suspension Take 15 mLs by mouth every 6 (six) hours as needed for indigestion., Starting Thu 06/11/2021, Normal    pantoprazole (PROTONIX) 20 MG tablet Take 1 tablet (20 mg total) by mouth daily for 10 days., Starting Thu 06/11/2021, Until Sun 06/21/2021, Normal    potassium chloride SA (KLOR-CON M) 20 MEQ tablet Take 2 tablets (40 mEq total) by mouth daily for 10 days., Starting Thu 06/11/2021, Until Sun 06/21/2021, Normal        Note:  This note was prepared with assistance of Dragon voice recognition software. Occasional wrong-word or sound-a-like substitutions may have occurred due to the inherent limitations of voice recognition software.    Avyukt Cimo, Barbara CowerJason, MD 06/11/21 [phone]

## 2021-06-19 ENCOUNTER — Encounter: Payer: Self-pay | Admitting: Neurology

## 2021-06-22 ENCOUNTER — Telehealth: Payer: Self-pay | Admitting: *Deleted

## 2021-06-22 NOTE — Telephone Encounter (Signed)
Language services called spoke to pt via [place] ID# [phone], relayed to bring machine and cord to appt. She verbalized understanding.  ?

## 2021-06-22 NOTE — Telephone Encounter (Signed)
I called language line using [employer] # [id number] spoke to husband as not able to reach pt.  Relayed that have appt tomorrow at 0945 for autopap f/u.  She using machine?  Sounds like she is.  I needed to speak to her about bringing machine and cord. I also LMVM Caryl Pina for [employer] about machine type when she received.   ?

## 2021-06-23 ENCOUNTER — Encounter: Payer: Self-pay | Admitting: Neurology

## 2021-06-23 ENCOUNTER — Ambulatory Visit: Payer: Medicaid Other | Admitting: Neurology

## 2021-06-23 VITALS — BP 104/77 | HR 66 | Ht 66.0 in | Wt 155.0 lb

## 2021-06-23 DIAGNOSIS — G4733 Obstructive sleep apnea (adult) (pediatric): Secondary | ICD-10-CM

## 2021-06-23 DIAGNOSIS — G43019 Migraine without aura, intractable, without status migrainosus: Secondary | ICD-10-CM | POA: Diagnosis not present

## 2021-06-23 MED ORDER — TOPIRAMATE 50 MG PO TABS: 100.0000 mg | ORAL_TABLET | Freq: Every day | ORAL | 5 refills | Status: DC

## 2021-06-23 NOTE — Patient Instructions (Signed)
It was nice to see you again today.  ? ?For your deviated nasal septum, I recommend, that you see Dr. Pollyann Kennedy again: you may want to call for a follow up appointment with him, his number is [phone].  ? ?Your autoPAP machine is not registering your usage, you have only used the machine very few days for an average of less than 1 hour. If you feel, you are using the machine more, please have the machine looked at by your DME provider, [facility], their address is:  ? ?[address], [address] ?[place], [place] [address] ?Ph: [phone] ? ?Please continue using your autoPAP regularly. While your insurance requires that you use PAP at least 4 hours each night on 70% of the nights, I recommend, that you not skip any nights and use it throughout the night if you can. Getting used to PAP and staying with the treatment long term does take time and patience and discipline. Untreated obstructive sleep apnea when it is moderate to severe can have an adverse impact on cardiovascular health and raise her risk for heart disease, arrhythmias, hypertension, congestive heart failure, stroke and diabetes. Untreated obstructive sleep apnea causes sleep disruption, nonrestorative sleep, and sleep deprivation. This can have an impact on your day to day functioning and cause daytime sleepiness and impairment of cognitive function, memory loss, mood disturbance, and problems focussing. Using PAP regularly can improve these symptoms. ? ?For migraine prevention, we will restart you on daily Topamax 50 mg: Take 1 pill each bedtime for one month, then 2 pills at bedtime daily thereafter. Common side effects reported are: Sedation, sleepiness, tingling, change in taste especially with carbonated drinks, and rare side effects are glaucoma, kidney stones, and problems with thinking including word finding difficulties. ? ?This medication is for daily prevention, NOT for as needed use. If you plan to get pregnant, please stop the medication.  It is also possible that Topamax may interfere with oral contraceptive pills (ie birth control pills). ? ?Follow up in 6 months with the nurse practitioner, we will hold off on the Maxalt prescription.  ?

## 2021-06-23 NOTE — Progress Notes (Deleted)
Subjective:  ?  ?Patient ID: Kristen Shaw is a 40 y.o. female. ? ?HPI ?{Common ambulatory SmartLinks:[id number]} ? ?Review of Systems ? ?Objective:  ?Neurological Exam ? ?Physical Exam ? ?Assessment:  ? ?*** ? ?Plan:  ? ?*** ? ?

## 2021-06-23 NOTE — Progress Notes (Signed)
Subjective:  ?  ?Patient ID: Kristen Shaw is a 41 y.o. female. ? ?HPI ? ? ? ?Interim history:  ? ?Kristen Shaw is a 41 year old right-handed woman with an underlying medical history of cholecystitis, tinnitus, allergies, and overweight state, who presents for follow-up consultation of her recurrent headaches as well as sleep apnea.  The patient is accompanied by a [place] interpreter today.  I first met her at the request of Dr. Constance Holster 1 08/26/2020, at which time she reported a longstanding history of recurrent headaches, essentially since elementary school.  She had a prescription for Topamax at the time through her primary care and had also tried Imitrex.  She had a brain MRI in February 2020 which was benign.  She was advised to increase her Topamax for prevention and I suggested we try her on Maxalt with the possibility of trying other newer medications in the near future such as Nurtec or Ubrelvy.  I suggested we proceed with a sleep study to rule out underlying obstructive sleep apnea as a contributor to her recurrent headaches.  She had a home sleep test on 10/14/2020 which indicated severe obstructive sleep apnea with an AHI of 45/h, O2 nadir 85% with mild to moderate snoring detected.  She was advised to start AutoPap therapy.  Her set up date was 02/13/2021.  She has a Personnel officer. ? ?Today, 06/23/2021: I reviewed AutoPap compliance data from the past 30 days as well as 90 days, she has very limited usage, in the past 90 days she had 9 out of 90 days of usage for an average of 50 minutes for days on treatment, average AHI 0.1/h, average leak on the lower side.  In the past 30 days her compliance is slightly better 4% use days greater than 4 hours still at 0%.  She used her machine 10 out of 30 days.  She reports that she uses her machine more than what it is indicated in the report.  She reports that she has used it more often but admits that she may use it for 1 to 2 hours only at a time.  She  wonders if there is an electrical defect on the machine.  She is encouraged to talk to her DME provider and take the machine in for troubleshooting.  She has used Maxalt but has not refilled it, does not want a refill, wants to avoid taking extra medication.  When she found out that she was pregnant she stopped all her medications and took Tylenol only.  She admits that she does not take the Topamax regularly, she uses the medicine as needed, once or twice a day only when she has a headache.  She is again advised that this is not a pain medication as needed medication for migraine attacks, it is a daily preventative.  She is willing to restart it as a preventative daily.  Of note, she had a miscarriage in November 2022, status post D&C on 02/17/2021. She recently presented to the emergency room on 06/11/2021 with abdominal pain.  She has a history of cholelithiasis.  She was not felt to have cholecystitis.  She had an MRI of the abdomen in October 2022.  Ultrasound of the abdomen on 06/11/2021 showed multiple gallstones without complicating factors.  She was found to have low potassium at 3.1 and was given oral potassium pills.  She was treated symptomatically with Maalox, and was also given Protonix. ? ?The patient's allergies, current medications, family history, past medical history, past social  history, past surgical history and problem list were reviewed and updated as appropriate.  ? ? ?Previously:  ? ?(She) reports recurrent headaches since she was in elementary school.  Headaches are often left-sided and throbbing, associated with nausea and light sensitivity, sometimes vomiting, also sound sensitivity, occasional blurry vision.  Frequency currently is around 3-4 times a week.  She has no other neurological accompaniments such as one-sided weakness or numbness or tingling or droopy face or slurring of speech.  She has occasional tearing and nasal congestion associated with her headache, typically on the left  side.  She was given a prescription for Topamax from her primary care physician recently for 50 mg as needed.  She has taken it as needed.  She has also tried Imitrex about 4 times through her primary care physician, reports that it did not help and she also felt it made her sleepy, she woke up once with pain on her left side, in the flank area.  She has no visual symptoms such as blurry vision, double vision or loss of vision, sleeps fairly well but does snore per her husband's feedback and does not typically get 7 or 8 hours of sleep.  She is in bed generally between midnight and 1 AM and rise time is around 6:30 AM.  She lives with her family which includes her husband and 4 children.  She does not work.  She tries to hydrate well with water.  She is a non-smoker, does not utilize alcohol and drinks caffeine, 1 large cup of coffee or sometimes 2 in the mornings.  She had a brain MRI with and without contrast through [facility] on 05/29/2018 and I was able to review the report, impression: Normal. ?She denies night to night nocturia but does wake up with a headache sometimes. ?  ?I reviewed your office note from 06/16/2020. ? ?Her Past Medical History Is Significant For: ?Past Medical History:  ?Diagnosis Date  ? Choledocholithiasis with acute cholecystitis   ? Migraines   ? ? ?Her Past Surgical History Is Significant For: ?Past Surgical History:  ?Procedure Laterality Date  ? DILATION AND EVACUATION N/A 02/17/2021  ? Procedure: SUCTION DILATATION AND EVACUATIOM;  Surgeon: Woodroe Mode, MD;  Location: [place];  Service: Gynecology;  Laterality: N/A;  ? ? ?Her Family History Is Significant For: ?Family History  ?Problem Relation Age of Onset  ? Heart disease Neg Hx   ? Sleep apnea Neg Hx   ? ? ?Her Social History Is Significant For: ?Social History  ? ?Socioeconomic History  ? Marital status: Married  ?  Spouse name: Kristen Shaw  ? Number of children: 4  ? Years of education: Not on file  ? Highest education  level: High school graduate  ?Occupational History  ? Occupation: Stay at home mom  ?Tobacco Use  ? Smoking status: Never  ? Smokeless tobacco: Never  ?Vaping Use  ? Vaping Use: Never used  ?Substance and Sexual Activity  ? Alcohol use: No  ? Drug use: No  ? Sexual activity: Yes  ?  Birth control/protection: None  ?Other Topics Concern  ? Not on file  ?Social History Narrative  ? Not on file  ? ?Social Determinants of Health  ? ?Financial Resource Strain: Not on file  ?Food Insecurity: Not on file  ?Transportation Needs: Not on file  ?Physical Activity: Not on file  ?Stress: Not on file  ?Social Connections: Not on file  ? ? ?Her Allergies Are:  ?Allergies  ?  Allergen Reactions  ? Banana Other (See Comments)  ?  Stomach upset.  ? Penicillin G Itching  ? Tomato Nausea And Vomiting  ?:  ? ?Her Current Medications Are:  ?Outpatient Encounter Medications as of 06/23/2021  ?Medication Sig  ? alum & mag hydroxide-simeth (MAALOX PLUS) 400-400-40 MG/5ML suspension Take 15 mLs by mouth every 6 (six) hours as needed for indigestion.  ? ferrous sulfate 325 (65 FE) MG EC tablet Take 1 tablet (325 mg total) by mouth every other day.  ? fluticasone (FLONASE) 50 MCG/ACT nasal spray Place 1 spray into both nostrils daily. (Patient taking differently: Place 1 spray into both nostrils daily as needed for allergies or rhinitis.)  ? ibuprofen (ADVIL) 600 MG tablet Take 1 tablet (600 mg total) by mouth every 6 (six) hours as needed.  ? ondansetron (ZOFRAN) 4 MG tablet Take 1 tablet (4 mg total) by mouth every 8 (eight) hours as needed for nausea or vomiting.  ? promethazine (PHENERGAN) 12.5 MG tablet Take 1 tablet (12.5 mg total) by mouth every 6 (six) hours as needed for nausea or vomiting.  ? promethazine (PHENERGAN) 25 MG tablet Take 1 tablet (25 mg total) by mouth every 6 (six) hours as needed for nausea or vomiting.  ? topiramate (TOPAMAX) 50 MG tablet Take 3 tablets (150 mg total) by mouth at bedtime. Please follow instructions  provided separately in writing  ? traMADol (ULTRAM) 50 MG tablet Take 1-2 tablets (50-100 mg total) by mouth every 6 (six) hours as needed.  ? pantoprazole (PROTONIX) 20 MG tablet Take 1 tablet (20 mg total) by mout

## 2021-09-01 ENCOUNTER — Other Ambulatory Visit: Payer: Self-pay | Admitting: Internal Medicine

## 2021-09-01 DIAGNOSIS — Z1231 Encounter for screening mammogram for malignant neoplasm of breast: Secondary | ICD-10-CM

## 2021-09-19 ENCOUNTER — Emergency Department (HOSPITAL_COMMUNITY)
Admission: EM | Admit: 2021-09-19 | Discharge: 2021-09-20 | Disposition: A | Discharge status: Home / Self Care | Payer: Medicaid Other | Attending: Emergency Medicine | Admitting: Emergency Medicine

## 2021-09-19 ENCOUNTER — Emergency Department (HOSPITAL_COMMUNITY): Payer: Medicaid Other

## 2021-09-19 ENCOUNTER — Other Ambulatory Visit: Payer: Self-pay

## 2021-09-19 DIAGNOSIS — R1011 Right upper quadrant pain: Secondary | ICD-10-CM | POA: Diagnosis present

## 2021-09-19 DIAGNOSIS — K8050 Calculus of bile duct without cholangitis or cholecystitis without obstruction: Secondary | ICD-10-CM

## 2021-09-19 LAB — I-STAT BETA HCG BLOOD, ED (MC, WL, AP ONLY): I-stat hCG, quantitative: <5 m[IU]/mL (ref <5)

## 2021-09-19 LAB — URINALYSIS, ROUTINE W REFLEX MICROSCOPIC
Bilirubin Urine: NEGATIVE
Glucose, UA: NEGATIVE mg/dL
Hgb urine dipstick: NEGATIVE
Ketones, ur: NEGATIVE mg/dL
Leukocytes,Ua: NEGATIVE
Nitrite: NEGATIVE
Protein, ur: NEGATIVE mg/dL
Specific Gravity, Urine: 1.003 — ABNORMAL LOW (ref 1.005–1.030)
pH: 6 (ref 5.0–8.0)

## 2021-09-19 LAB — CBC
HCT: 35.3 % — ABNORMAL LOW (ref 36.0–46.0)
Hemoglobin: 10.9 g/dL — ABNORMAL LOW (ref 12.0–15.0)
MCH: 25.3 pg — ABNORMAL LOW (ref 26.0–34.0)
MCHC: 30.9 g/dL (ref 30.0–36.0)
MCV: 82.1 fL (ref 80.0–100.0)
Platelets: 333 10*3/uL (ref 150–400)
RBC: 4.3 MIL/uL (ref 3.87–5.11)
RDW: 15.9 % — ABNORMAL HIGH (ref 11.5–15.5)
WBC: 7 10*3/uL (ref 4.0–10.5)
nRBC: 0 % (ref 0.0–0.2)

## 2021-09-19 LAB — COMPREHENSIVE METABOLIC PANEL
ALT: 12 U/L (ref 0–44)
AST: 15 U/L (ref 15–41)
Albumin: 3.5 g/dL (ref 3.5–5.0)
Alkaline Phosphatase: 58 U/L (ref 38–126)
Anion gap: 8 (ref 5–15)
BUN: 10 mg/dL (ref 6–20)
CO2: 23 mmol/L (ref 22–32)
Calcium: 8.6 mg/dL — ABNORMAL LOW (ref 8.9–10.3)
Chloride: 105 mmol/L (ref 98–111)
Creatinine, Ser: 0.68 mg/dL (ref 0.44–1.00)
GFR, Estimated: >60 mL/min (ref >60)
Glucose, Bld: 100 mg/dL — ABNORMAL HIGH (ref 70–99)
Potassium: 3.5 mmol/L (ref 3.5–5.1)
Sodium: 136 mmol/L (ref 135–145)
Total Bilirubin: 0.4 mg/dL (ref 0.3–1.2)
Total Protein: 7.1 g/dL (ref 6.5–8.1)

## 2021-09-19 LAB — LIPASE, BLOOD: Lipase: 45 U/L (ref 11–51)

## 2021-09-19 MED ORDER — HYDROCODONE-ACETAMINOPHEN 5-325 MG PO TABS: 1.0000 | ORAL_TABLET | Freq: Four times a day (QID) | ORAL | 0 refills | Status: DC | PRN

## 2021-09-19 MED ORDER — MORPHINE SULFATE (PF) 4 MG/ML IV SOLN
4.0000 mg | Freq: Once | INTRAVENOUS | Status: DC
  Filled 2021-09-19: qty 1

## 2021-09-19 MED ORDER — KETOROLAC TROMETHAMINE 30 MG/ML IJ SOLN
30.0000 mg | Freq: Once | INTRAMUSCULAR | Status: AC
  Administered 2021-09-19: 30 mg via INTRAVENOUS
  Filled 2021-09-19: qty 1

## 2021-09-19 MED ORDER — NAPROXEN 375 MG PO TABS: 375.0000 mg | ORAL_TABLET | Freq: Two times a day (BID) | ORAL | 0 refills | Status: DC

## 2021-09-19 MED ORDER — ONDANSETRON HCL 4 MG/2ML IJ SOLN
4.0000 mg | Freq: Once | INTRAMUSCULAR | Status: AC
  Administered 2021-09-19: 4 mg via INTRAVENOUS
  Filled 2021-09-19: qty 2

## 2021-09-19 NOTE — ED Triage Notes (Signed)
Pt w 9/10RUQ abd pain x3 days, associated nausea. Denies vomiting, diarrhea, urinary symptoms. LBM today.LMP last week

## 2021-09-19 NOTE — ED Notes (Signed)
US at bedside at this time 

## 2021-09-19 NOTE — ED Notes (Signed)
Pt ambulating to room at this time without assistance

## 2021-09-19 NOTE — ED Notes (Signed)
Pt requires translator. Translator machine at bedside

## 2021-09-19 NOTE — ED Notes (Signed)
Provider at bedside

## 2021-09-19 NOTE — ED Notes (Signed)
Not in room at this time 

## 2021-09-19 NOTE — ED Provider Notes (Signed)
[facility] EMERGENCY DEPARTMENT Provider Note   CSN: [id number] Arrival date & time: 09/19/21  1836     History  Chief Complaint  Patient presents with   Abdominal Pain    Kristen Shaw is a 41 y.o. female.   Abdominal Pain   Patient has history of migraines, choledocholithiasis, cholelithiasis who presents to the ED with complaints of right upper quadrant abdominal pain.  Patient states she has been diagnosed with gallbladder problems.  She is supposed to follow-up with general surgery.  Patient states she has been having pain in her right upper abdomen for the last several days.  She has had nausea but no vomiting.  No dysuria.  No diarrhea.  No fevers.   Home Medications Prior to Admission medications   Medication Sig Start Date End Date Taking? Authorizing Provider  HYDROcodone-acetaminophen (NORCO/VICODIN) 5-325 MG tablet Take 1 tablet by mouth every 6 (six) hours as needed. 09/19/21  Yes Linwood Dibbles, MD  naproxen (NAPROSYN) 375 MG tablet Take 1 tablet (375 mg total) by mouth 2 (two) times daily. 09/19/21  Yes Linwood Dibbles, MD  alum & mag hydroxide-simeth (MAALOX PLUS) 400-400-40 MG/5ML suspension Take 15 mLs by mouth every 6 (six) hours as needed for indigestion. 06/11/21   Mesner, Barbara Cower, MD  ferrous sulfate 325 (65 FE) MG EC tablet Take 1 tablet (325 mg total) by mouth every other day. 02/21/21   Gerrit Heck, CNM  fluticasone (FLONASE) 50 MCG/ACT nasal spray Place 1 spray into both nostrils daily. Patient taking differently: Place 1 spray into both nostrils daily as needed for allergies or rhinitis. 11/30/17   Dahlia Byes A, NP  ibuprofen (ADVIL) 600 MG tablet Take 1 tablet (600 mg total) by mouth every 6 (six) hours as needed. 02/17/21   Adam Phenix, MD  ondansetron (ZOFRAN) 4 MG tablet Take 1 tablet (4 mg total) by mouth every 8 (eight) hours as needed for nausea or vomiting. 01/24/21   Rai, Ripudeep K, MD  pantoprazole (PROTONIX) 20 MG tablet Take 1  tablet (20 mg total) by mouth daily for 10 days. 06/11/21 06/21/21  Mesner, Barbara Cower, MD  potassium chloride SA (KLOR-CON M) 20 MEQ tablet Take 2 tablets (40 mEq total) by mouth daily for 10 days. 06/11/21 06/21/21  Mesner, Barbara Cower, MD  promethazine (PHENERGAN) 12.5 MG tablet Take 1 tablet (12.5 mg total) by mouth every 6 (six) hours as needed for nausea or vomiting. 02/14/21   Leftwich-Kirby, Wilmer Floor, CNM  promethazine (PHENERGAN) 25 MG tablet Take 1 tablet (25 mg total) by mouth every 6 (six) hours as needed for nausea or vomiting. 02/09/21   Gerrit Heck, CNM  topiramate (TOPAMAX) 50 MG tablet Take 2 tablets (100 mg total) by mouth at bedtime. Please follow instructions provided separately in writing 06/23/21   Huston Foley, MD  traMADol (ULTRAM) 50 MG tablet Take 1-2 tablets (50-100 mg total) by mouth every 6 (six) hours as needed. 02/14/21   Leftwich-Kirby, Wilmer Floor, CNM      Allergies    Banana, Penicillin g, and Tomato    Review of Systems   Review of Systems  Gastrointestinal:  Positive for abdominal pain.    Physical Exam Updated Vital Signs BP 122/90   Pulse 77   Temp 98.7 F (37.1 C) (Oral)   Resp 16   SpO2 100%  Physical Exam Vitals and nursing note reviewed.  Constitutional:      General: She is not in acute distress.    Appearance: She is  well-developed.  HENT:     Head: Normocephalic and atraumatic.     Right Ear: External ear normal.     Left Ear: External ear normal.  Eyes:     General: No scleral icterus.       Right eye: No discharge.        Left eye: No discharge.     Conjunctiva/sclera: Conjunctivae normal.  Neck:     Trachea: No tracheal deviation.  Cardiovascular:     Rate and Rhythm: Normal rate and regular rhythm.  Pulmonary:     Effort: Pulmonary effort is normal. No respiratory distress.     Breath sounds: Normal breath sounds. No stridor. No wheezing or rales.  Abdominal:     General: Bowel sounds are normal. There is no distension.     Palpations: Abdomen  is soft.     Tenderness: There is abdominal tenderness in the right upper quadrant. There is no guarding or rebound.  Musculoskeletal:        General: No tenderness or deformity.     Cervical back: Neck supple.  Skin:    General: Skin is warm and dry.     Findings: No rash.  Neurological:     General: No focal deficit present.     Mental Status: She is alert.     Cranial Nerves: No cranial nerve deficit (no facial droop, extraocular movements intact, no slurred speech).     Sensory: No sensory deficit.     Motor: No abnormal muscle tone or seizure activity.     Coordination: Coordination normal.  Psychiatric:        Mood and Affect: Mood normal.     ED Results / Procedures / Treatments   Labs (all labs ordered are listed, but only abnormal results are displayed) Labs Reviewed  COMPREHENSIVE METABOLIC PANEL - Abnormal; Notable for the following components:      Result Value   Glucose, Bld 100 (*)    Calcium 8.6 (*)    All other components within normal limits  CBC - Abnormal; Notable for the following components:   Hemoglobin 10.9 (*)    HCT 35.3 (*)    MCH 25.3 (*)    RDW 15.9 (*)    All other components within normal limits  URINALYSIS, ROUTINE W REFLEX MICROSCOPIC - Abnormal; Notable for the following components:   Color, Urine STRAW (*)    Specific Gravity, Urine 1.003 (*)    All other components within normal limits  LIPASE, BLOOD  I-STAT BETA HCG BLOOD, ED (MC, [facility], AP ONLY)    EKG None  Radiology US Abdomen Limited RUQ (LIVER/GB)  Result Date: 09/19/2021 CLINICAL DATA:  Right upper quadrant abdominal pain. EXAM: ULTRASOUND ABDOMEN LIMITED RIGHT UPPER QUADRANT COMPARISON:  Ultrasound dated 06/11/2021. FINDINGS: Gallbladder: The gallbladder is filled with stones. No gallbladder wall thickening or pericholecystic fluid. Negative sonographic Murphy's sign. Evaluation for sonographic Murphy's sign however was limited as the patient was pre-medicated for pain. Common  bile duct: Diameter: 5 mm Liver: No focal lesion identified. Within normal limits in parenchymal echogenicity. Portal vein is patent on color Doppler imaging with normal direction of blood flow towards the liver. Other: None. IMPRESSION: Cholelithiasis without sonographic evidence of acute cholecystitis. Electronically Signed   By: Elgie Collard M.D.   On: 09/19/2021 22:56    Procedures Procedures    Medications Ordered in ED Medications  morphine (PF) 4 MG/ML injection 4 mg (4 mg Intravenous Not Given 09/19/21 2221)  ondansetron (ZOFRAN) injection  4 mg (4 mg Intravenous Given 09/19/21 2213)  ketorolac (TORADOL) 30 MG/ML injection 30 mg (30 mg Intravenous Given 09/19/21 2214)    ED Course/ Medical Decision Making/ A&P Clinical Course as of 09/19/21 2346  Sat Sep 19, 2021  2315 Ultrasound shows cholelithiasis without sonographic evidence of cholecystitis [JK]  2315 CBC(!) Normal except for mild anemia [JK]  2315 Comprehensive metabolic panel(!) Metabolic panel normal [JK]  2315 I-Stat beta hCG blood, ED Negative [JK]  2315 Lipase, blood Normal [JK]    Clinical Course User Index [JK] Linwood Dibbles, MD                           Medical Decision Making Problems Addressed: Biliary colic: acute illness or injury  Amount and/or Complexity of Data Reviewed Labs: ordered. Decision-making details documented in ED Course. Radiology: ordered.  Risk Prescription drug management. Decision regarding hospitalization.   Pt presented to the ED with RUQ pain.  Pt has history of gallstones.  Pt has a referral to gen surg however appt is not until [place].  ED work up without signs of acute cholecystitis.  Sx improved with toradol.  Appears stable for discharge, close outpatient follow up with general surgery.  Evaluation and diagnostic testing in the emergency department does not suggest an emergent condition requiring admission or immediate intervention beyond what has been performed at this  time.  The patient is safe for discharge and has been instructed to return immediately for worsening symptoms, change in symptoms or any other concerns.         Final Clinical Impression(s) / ED Diagnoses Final diagnoses:  Biliary colic    Rx / DC Orders ED Discharge Orders          Ordered    naproxen (NAPROSYN) 375 MG tablet  2 times daily        09/19/21 2335    HYDROcodone-acetaminophen (NORCO/VICODIN) 5-325 MG tablet  Every 6 hours PRN        09/19/21 2335              Linwood Dibbles, MD 09/19/21 2348

## 2021-09-19 NOTE — Discharge Instructions (Addendum)
Take the medications as needed for pain.  Return to the ER for fevers chills worsening symptoms.

## 2021-10-14 ENCOUNTER — Ambulatory Visit (INDEPENDENT_AMBULATORY_CARE_PROVIDER_SITE_OTHER): Payer: Medicaid Other | Admitting: Student

## 2021-10-14 ENCOUNTER — Encounter: Payer: Self-pay | Admitting: Student

## 2021-10-14 ENCOUNTER — Other Ambulatory Visit (HOSPITAL_COMMUNITY)
Admission: RE | Admit: 2021-10-14 | Discharge: 2021-10-14 | Disposition: A | Discharge status: Home / Self Care | Payer: Medicaid Other | Source: Ambulatory Visit | Attending: Student | Admitting: Student

## 2021-10-14 VITALS — BP 134/90 | HR 76 | Ht 66.0 in | Wt 157.8 lb

## 2021-10-14 DIAGNOSIS — Z01419 Encounter for gynecological examination (general) (routine) without abnormal findings: Secondary | ICD-10-CM

## 2021-10-14 DIAGNOSIS — Z789 Other specified health status: Secondary | ICD-10-CM | POA: Diagnosis not present

## 2021-10-14 NOTE — Progress Notes (Signed)
ANNUAL EXAM Patient name: Kristen Shaw MRN [id number]  Date of birth: Feb 14, 1981 Chief Complaint:   New Patient (Initial Visit) No Concerns History of Present Illness:   Kristen Shaw is a 41 y.o. [phone] African-American female being seen today for a routine annual exam.  Current complaints: None  Patient's last menstrual period was 10/05/2021.   Upstream - 10/14/21 2831       Pregnancy Intention Screening   Does the patient want to become pregnant in the next year? No    Does the patient's partner want to become pregnant in the next year? Unsure    Would the patient like to discuss contraceptive options today? No            The pregnancy intention screening data noted above was reviewed. Potential methods of contraception were discussed. The patient elected to proceed without further intervention/prevention management.  Last pap 2019. Results were: NILM w/ HRHPV negative. H/O abnormal pap: no Last mammogram: N/A. Results were: N/A. Family h/o breast cancer: unknown Last colonoscopy: n/a. Results were: N/A. Family h/o colorectal cancer: unknown     10/14/2021    9:26 AM 05/11/2016    9:53 AM 10/10/2015    2:50 PM  Depression screen PHQ 2/9  Decreased Interest 0 0 0  Down, Depressed, Hopeless 0 0 0  PHQ - 2 Score 0 0 0  Altered sleeping 0 0   Tired, decreased energy 0 0   Change in appetite 0 0   Feeling bad or failure about yourself  0 0   Trouble concentrating 0 0   Moving slowly or fidgety/restless 0 0   Suicidal thoughts 0 0   PHQ-9 Score 0 0         10/14/2021    9:27 AM 05/11/2016   10:01 AM  GAD 7 : Generalized Anxiety Score  Nervous, Anxious, on Edge 0 0  Control/stop worrying 0 0  Worry too much - different things 0 0  Trouble relaxing 0 0  Restless 0 0  Easily annoyed or irritable 0 0  Afraid - awful might happen 0 0  Total GAD 7 Score 0 0     Review of Systems:   Pertinent items are noted in HPI Denies any headaches, blurred vision,  fatigue, shortness of breath, chest pain, abdominal pain, abnormal vaginal discharge/itching/odor/irritation, problems with periods, bowel movements, urination, or intercourse unless otherwise stated above. Pertinent History Reviewed:  Reviewed past medical,surgical, social and family history.  Reviewed problem list, medications and allergies. Physical Assessment:   Vitals:   10/14/21 0919  BP: 134/90  Pulse: 76  Weight: 157 lb 12.8 oz (71.6 kg)  Height: 5\' 6"  (1.676 m)  Body mass index is 25.47 kg/m.        Physical Examination:   General appearance - well appearing, and in no distress  Mental status - alert, oriented to person, place, and time  Psych:  She has a normal mood and affect  Skin - warm and dry, normal color, no suspicious lesions noted  Chest - effort normal, all lung fields clear to auscultation bilaterally  Heart - normal rate and regular rhythm  Neck:  midline trachea, no thyromegaly or nodules  Breasts - breasts appear normal, no suspicious masses, no skin or nipple changes or axillary nodes  Abdomen - soft, nontender, nondistended, no masses or organomegaly  Pelvic - VULVA: normal appearing vulva with no masses, tenderness or lesions  VAGINA: normal appearing vagina with normal color and discharge, no  lesions  CERVIX: normal appearing cervix without discharge or lesions, no CMT  Thin prep pap is done w/ HR HPV cotesting  UTERUS: uterus is felt to be normal size, shape, consistency and nontender   ADNEXA: No adnexal masses or tenderness noted.  Extremities:  No swelling or varicosities noted  Chaperone present for exam  No results found for this or any previous visit (from the past 24 hour(s)).  Assessment & Plan:  1. Women's annual routine gynecological examination - No concerns present - Discussed routine screening for breast cancer starting at age 34 - Cytology - PAP( [place])  2. Language barrier - Interpreter at the bedside  Mammogram: @ 40yo, or  sooner if problems Colonoscopy: @ 40yo, or sooner if problems  No orders of the defined types were placed in this encounter.   Meds: No orders of the defined types were placed in this encounter.   Follow-up: Return in about 1 year (around 10/15/2022) for ANN, IN-PERSON.  Corlis Hove, NP 10/14/2021 1:38 PM

## 2021-10-14 NOTE — Progress Notes (Signed)
Patient presents as a new patient to establish care for an AEX. Patient has no concerns today. Patient denies having any vaginal discharge, odor, or irritation.  Last pap: 10/17/2017 Normal.

## 2021-10-21 LAB — CYTOLOGY - PAP
Comment: NEGATIVE
Diagnosis: NEGATIVE
High risk HPV: NEGATIVE

## 2021-12-23 ENCOUNTER — Telehealth: Payer: Self-pay | Admitting: *Deleted

## 2021-12-23 NOTE — Telephone Encounter (Signed)
Spoke with husband (DPR CHECKED) Husband states patient is currently out the country . Informed husband will cancel patients appointment and when she gets back in the  country call and reschedule appointment  Husband expressed understanding and thanked me for calling

## 2021-12-23 NOTE — Progress Notes (Deleted)
Patient: Kristen Shaw Date of Birth: 06/09/1980  Reason for Visit: Follow up History from: Patient Primary Neurologist: Kristen Shaw   ASSESSMENT AND PLAN 41 y.o. year old female    HISTORY OF PRESENT ILLNESS: Today 12/23/21 Kristen Shaw   HISTORY  Copied Dr. Rexene Shaw: Kristen Shaw is a 41 year old right-handed woman with an underlying medical history of cholecystitis, tinnitus, allergies, and overweight state, who presents for follow-up consultation of her recurrent headaches as well as sleep apnea.  The patient is accompanied by a Kristen Shaw interpreter today.  I first met her at the request of Dr. Constance Shaw 1 08/26/2020, at which time she reported a longstanding history of recurrent headaches, essentially since elementary school.  She had a prescription for Topamax at the time through her primary care and had also tried Imitrex.  She had a brain MRI in February 2020 which was benign.  She was advised to increase her Topamax for prevention and I suggested we try her on Maxalt with the possibility of trying other newer medications in the near future such as Nurtec or Ubrelvy.  I suggested we proceed with a sleep study to rule out underlying obstructive sleep apnea as a contributor to her recurrent headaches.  She had a home sleep test on 10/14/2020 which indicated severe obstructive sleep apnea with an AHI of 45/h, O2 nadir 85% with mild to moderate snoring detected.  She was advised to start AutoPap therapy.  Her set up date was 02/13/2021.  She has a Personnel officer.   Today, 06/23/2021: I reviewed AutoPap compliance data from the past 30 days as well as 90 days, she has very limited usage, in the past 90 days she had 9 out of 90 days of usage for an average of 50 minutes for days on treatment, average AHI 0.1/h, average leak on the lower side.  In the past 30 days her compliance is slightly better 4% use days greater than 4 hours still at 0%.  She used her machine 10 out of 30 days.  She reports  that she uses her machine more than what it is indicated in the report.  She reports that she has used it more often but admits that she may use it for 1 to 2 hours only at a time.  She wonders if there is an electrical defect on the machine.  She is encouraged to talk to her DME provider and take the machine in for troubleshooting.  She has used Maxalt but has not refilled it, does not want a refill, wants to avoid taking extra medication.  When she found out that she was pregnant she stopped all her medications and took Tylenol only.  She admits that she does not take the Topamax regularly, she uses the medicine as needed, once or twice a day only when she has a headache.  She is again advised that this is not a pain medication as needed medication for migraine attacks, it is a daily preventative.  She is willing to restart it as a preventative daily.  Of note, she had a miscarriage in November 2022, status post D&C on 02/17/2021. She recently presented to the emergency room on 06/11/2021 with abdominal pain.  She has a history of cholelithiasis.  She was not felt to have cholecystitis.  She had an MRI of the abdomen in October 2022.  Ultrasound of the abdomen on 06/11/2021 showed multiple gallstones without complicating factors.  She was found to have low potassium at 3.1 and was given  oral potassium pills.  She was treated symptomatically with Maalox, and was also given Protonix.   The patient's allergies, current medications, family history, past medical history, past social history, past surgical history and problem list were reviewed and updated as appropriate.    Previously:    (She) reports recurrent headaches since she was in elementary school.  Headaches are often left-sided and throbbing, associated with nausea and light sensitivity, sometimes vomiting, also sound sensitivity, occasional blurry vision.  Frequency currently is around 3-4 times a week.  She has no other neurological accompaniments such  as one-sided weakness or numbness or tingling or droopy face or slurring of speech.  She has occasional tearing and nasal congestion associated with her headache, typically on the left side.  She was given a prescription for Topamax from her primary care physician recently for 50 mg as needed.  She has taken it as needed.  She has also tried Imitrex about 4 times through her primary care physician, reports that it did not help and she also felt it made her sleepy, she woke up once with pain on her left side, in the flank area.  She has no visual symptoms such as blurry vision, double vision or loss of vision, sleeps fairly well but does snore per her husband's feedback and does not typically get 7 or 8 hours of sleep.  She is in bed generally between midnight and 1 AM and rise time is around 6:30 AM.  She lives with her family which includes her husband and 4 children.  She does not work.  She tries to hydrate well with water.  She is a non-smoker, does not utilize alcohol and drinks caffeine, 1 large cup of coffee or sometimes 2 in the mornings.  She had a brain MRI with and without contrast through [facility] on 05/29/2018 and I was able to review the report, impression: Normal. She denies night to night nocturia but does wake up with a headache sometimes.    REVIEW OF SYSTEMS: Out of a complete 14 system review of symptoms, the patient complains only of the following symptoms, and all other reviewed systems are negative.  See HPI  ALLERGIES: Allergies  Allergen Reactions   Banana Other (See Comments)    Stomach upset.   Penicillin G Itching   Tomato Nausea And Vomiting    HOME MEDICATIONS: Outpatient Medications Prior to Visit  Medication Sig Dispense Refill   ferrous sulfate 325 (65 FE) MG EC tablet Take 1 tablet (325 mg total) by mouth every other day. 45 tablet 1   fluticasone (FLONASE) 50 MCG/ACT nasal spray Place 1 spray into both nostrils daily. (Patient taking differently: Place 1  spray into both nostrils daily as needed for allergies or rhinitis.) 16 g 2   ibuprofen (ADVIL) 600 MG tablet Take 1 tablet (600 mg total) by mouth every 6 (six) hours as needed. 60 tablet 3   naproxen (NAPROSYN) 375 MG tablet Take 1 tablet (375 mg total) by mouth 2 (two) times daily. 20 tablet 0   ondansetron (ZOFRAN) 4 MG tablet Take 1 tablet (4 mg total) by mouth every 8 (eight) hours as needed for nausea or vomiting. 30 tablet 0   pantoprazole (PROTONIX) 20 MG tablet Take 1 tablet (20 mg total) by mouth daily for 10 days. 10 tablet 0   potassium chloride SA (KLOR-CON M) 20 MEQ tablet Take 2 tablets (40 mEq total) by mouth daily for 10 days. 20 tablet 0   topiramate (TOPAMAX)  50 MG tablet Take 2 tablets (100 mg total) by mouth at bedtime. Please follow instructions provided separately in writing 60 tablet 5   traMADol (ULTRAM) 50 MG tablet Take 1-2 tablets (50-100 mg total) by mouth every 6 (six) hours as needed. 10 tablet 0   No facility-administered medications prior to visit.    PAST MEDICAL HISTORY: Past Medical History:  Diagnosis Date   Choledocholithiasis with acute cholecystitis    Migraines     PAST SURGICAL HISTORY: Past Surgical History:  Procedure Laterality Date   DILATION AND EVACUATION N/A 02/17/2021   Procedure: SUCTION DILATATION AND EVACUATIOM;  Surgeon: Woodroe Mode, MD;  Location: [facility];  Service: Gynecology;  Laterality: N/A;    FAMILY HISTORY: Family History  Problem Relation Age of Onset   Heart disease Neg Hx    Sleep apnea Neg Hx     SOCIAL HISTORY: Social History   Socioeconomic History   Marital status: Married    Spouse name: Mussa Mohaned   Number of children: 4   Years of education: Not on file   Highest education level: High school graduate  Occupational History   Occupation: Stay at home mom  Tobacco Use   Smoking status: Never   Smokeless tobacco: Never  Vaping Use   Vaping Use: Never used  Substance and Sexual Activity    Alcohol use: No   Drug use: No   Sexual activity: Yes    Partners: Male    Birth control/protection: None  Other Topics Concern   Not on file  Social History Narrative   Not on file   Social Determinants of Health   Financial Resource Strain: Not on file  Food Insecurity: Not on file  Transportation Needs: Not on file  Physical Activity: Not on file  Stress: Not on file  Social Connections: Not on file  Intimate Partner Violence: Not on file    PHYSICAL EXAM  There were no vitals filed for this visit. There is no height or weight on file to calculate BMI.  Generalized: Well developed, in no acute distress  Neurological examination  Mentation: Alert oriented to time, place, history taking. Follows all commands speech and language fluent Cranial nerve II-XII: Pupils were equal round reactive to light. Extraocular movements were full, visual field were full on confrontational test. Facial sensation and strength were normal. Uvula tongue midline. Head turning and shoulder shrug  were normal and symmetric. Motor: The motor testing reveals 5 over 5 strength of all 4 extremities. Good symmetric motor tone is noted throughout.  Sensory: Sensory testing is intact to soft touch on all 4 extremities. No evidence of extinction is noted.  Coordination: Cerebellar testing reveals good finger-nose-finger and heel-to-shin bilaterally.  Gait and station: Gait is normal. Tandem gait is normal. Romberg is negative. No drift is seen.  Reflexes: Deep tendon reflexes are symmetric and normal bilaterally.   DIAGNOSTIC DATA (LABS, IMAGING, TESTING) - I reviewed patient records, labs, notes, testing and imaging myself where available.  Lab Results  Component Value Date   WBC 7.0 09/19/2021   HGB 10.9 (L) 09/19/2021   HCT 35.3 (L) 09/19/2021   MCV 82.1 09/19/2021   PLT 333 09/19/2021      Component Value Date/Time   NA 136 09/19/2021 1900   NA 135 02/09/2021 1413   K 3.5 09/19/2021 1900    CL 105 09/19/2021 1900   CO2 23 09/19/2021 1900   GLUCOSE 100 (H) 09/19/2021 1900   BUN 10 09/19/2021 1900  BUN 8 02/09/2021 1413   CREATININE 0.68 09/19/2021 1900   CREATININE 0.65 05/15/2021 1052   CALCIUM 8.6 (L) 09/19/2021 1900   PROT 7.1 09/19/2021 1900   PROT 7.0 02/09/2021 1413   ALBUMIN 3.5 09/19/2021 1900   ALBUMIN 4.2 02/09/2021 1413   AST 15 09/19/2021 1900   ALT 12 09/19/2021 1900   ALKPHOS 58 09/19/2021 1900   BILITOT 0.4 09/19/2021 1900   BILITOT <0.2 02/09/2021 1413   GFRNONAA >60 09/19/2021 1900   GFRNONAA >89 10/10/2015 1529   GFRAA >60 08/10/2019 0857   GFRAA >89 10/10/2015 1529   Lab Results  Component Value Date   CHOL 182 12/02/2020   HDL 68 12/02/2020   LDLCALC 98 12/02/2020   TRIG 70 12/02/2020   CHOLHDL 2.7 12/02/2020   Lab Results  Component Value Date   HGBA1C 5.6 02/09/2021   No results found for: "VITAMINB12" Lab Results  Component Value Date   TSH 1.49 12/02/2020    Butler Denmark, AGNP-C, DNP 12/23/2021, 2:00 PM [facility] [address], [place], [place] [address] [phone]

## 2021-12-24 ENCOUNTER — Ambulatory Visit: Payer: Medicaid Other | Admitting: Neurology

## 2022-02-05 ENCOUNTER — Ambulatory Visit: Payer: Medicaid Other

## 2022-02-18 IMAGING — US US OB COMP LESS 14 WK
1 series · 15 of 23 positions shown · non-contrast
Comparison: None.

CLINICAL DATA: Bleeding.

EXAM:
OBSTETRIC <14 WK US AND TRANSVAGINAL OB US
TECHNIQUE: Both transabdominal and transvaginal ultrasound examinations were
performed for complete evaluation of the gestation as well as the
maternal uterus, adnexal regions, and pelvic cul-de-sac.
Transvaginal technique was performed to assess early pregnancy.

[Series 1: us ob comp less 14 wk · 23 acquisitions, 15 frames shown]
[im 1/23]
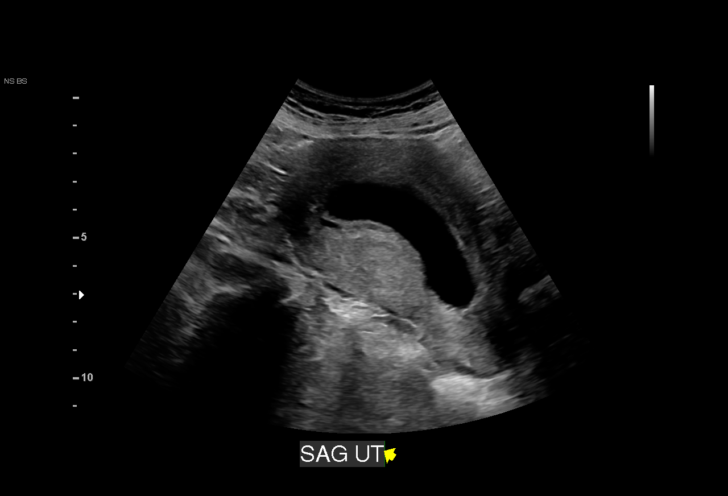
[im 3/23]
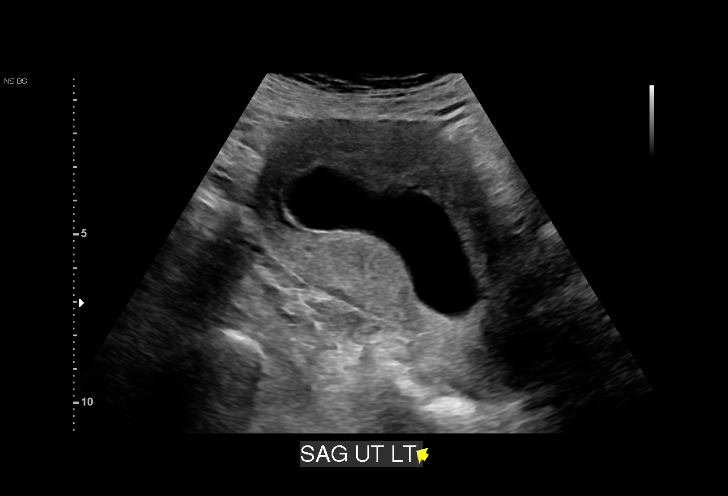
[im 4/23]
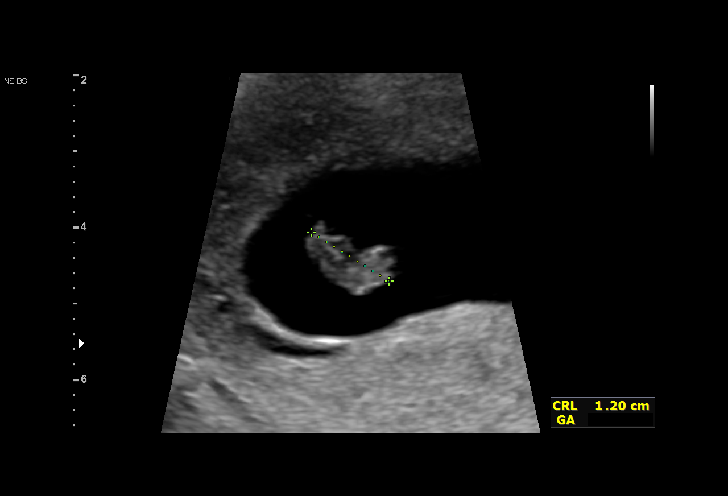
[im 6/23]
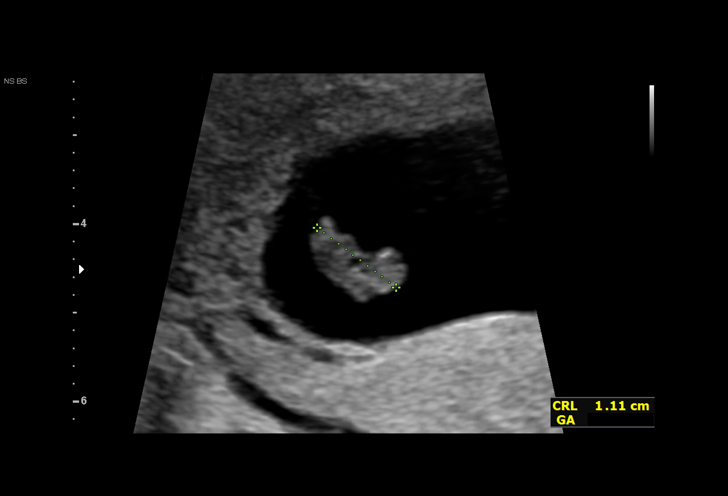
[im 7/23]
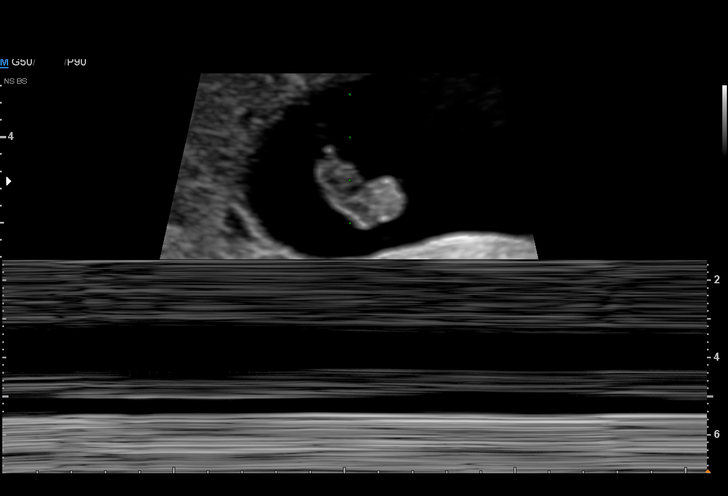
[im 9/23]
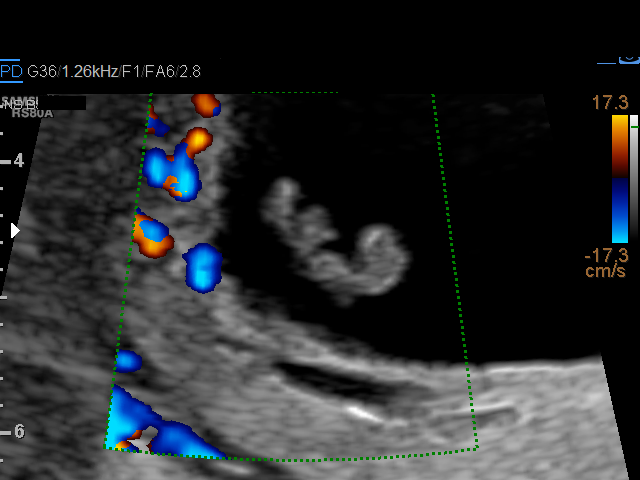
[im 10/23]
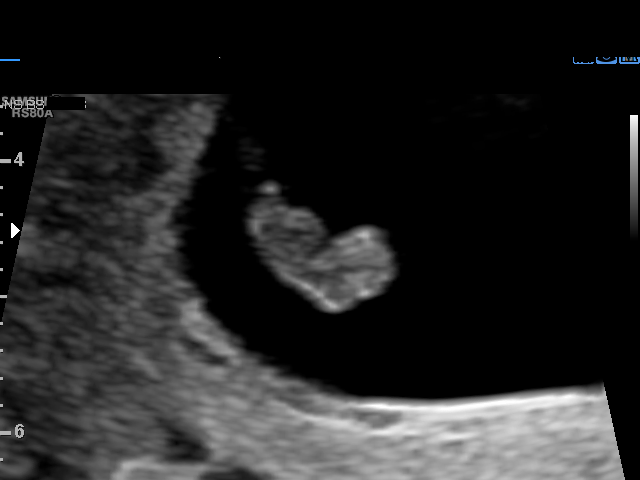
[im 12/23]
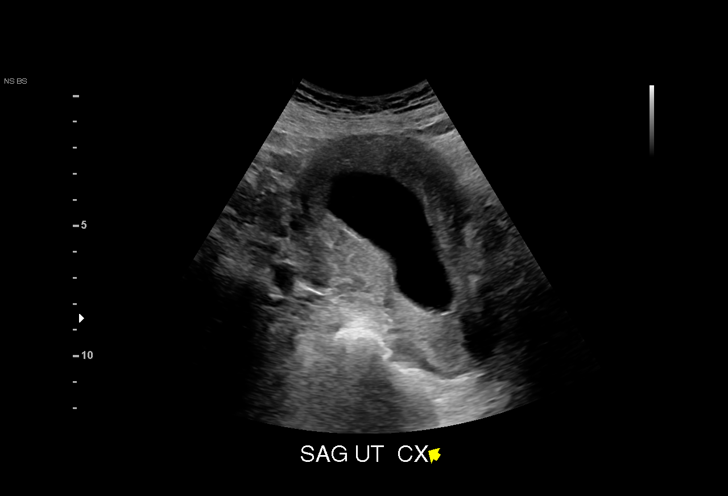
[im 14/23]
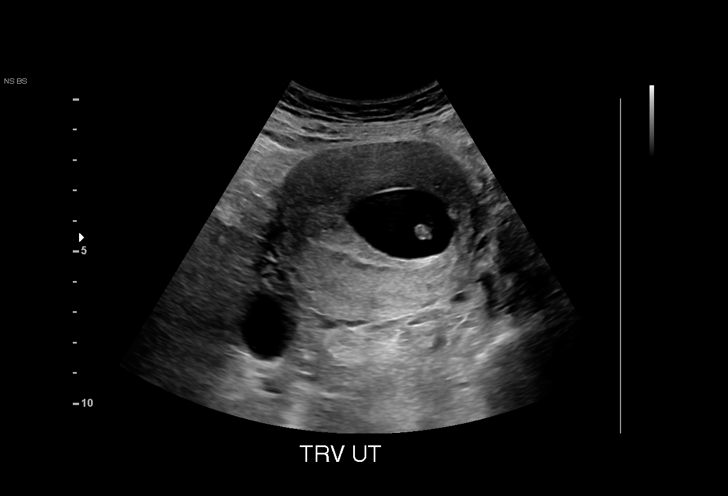
[im 15/23]
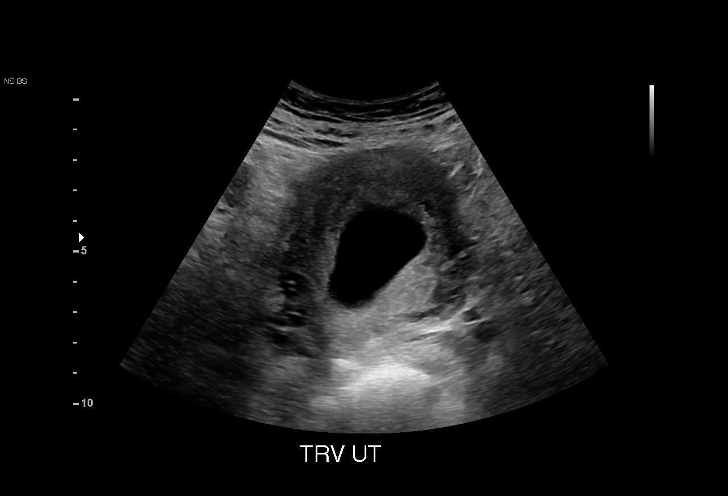
[im 17/23]
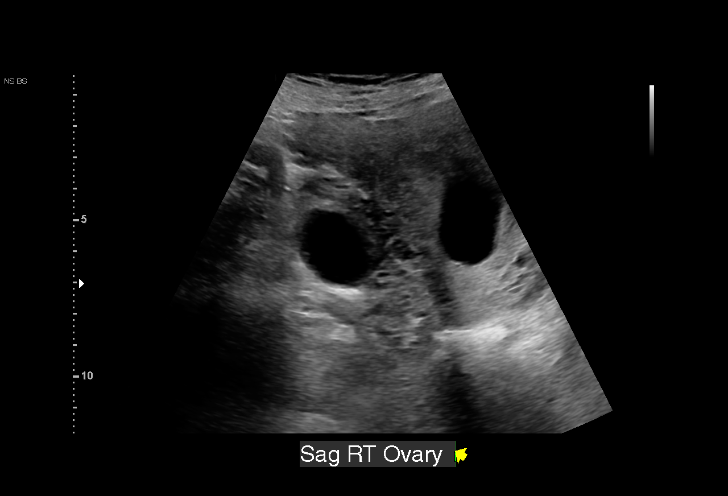
[im 18/23]
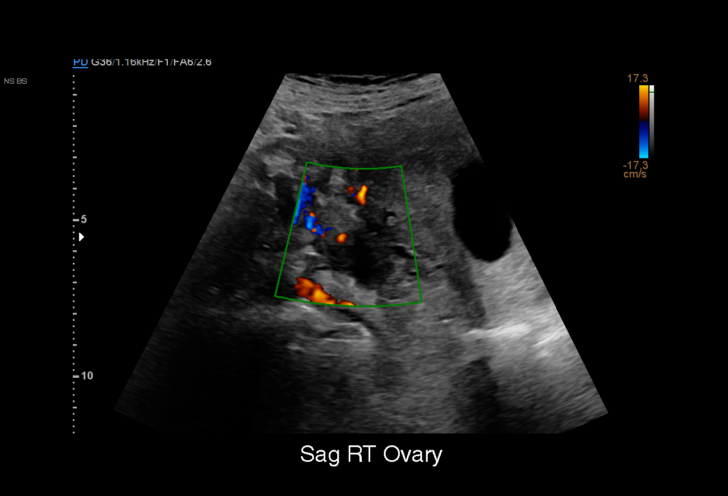
[im 20/23]
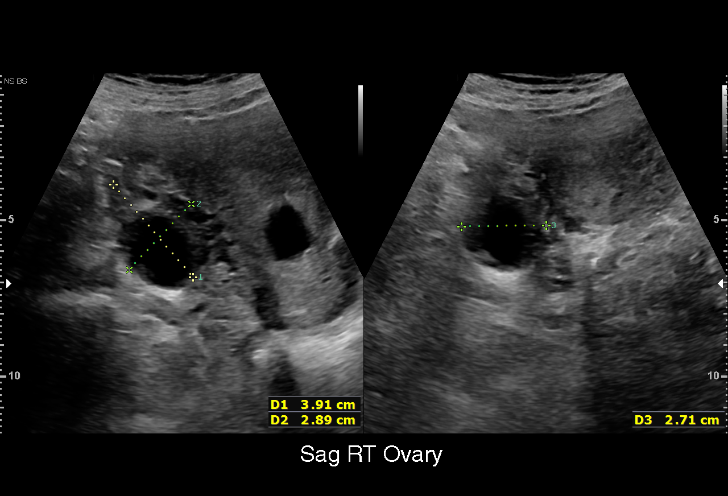
[im 21/23]
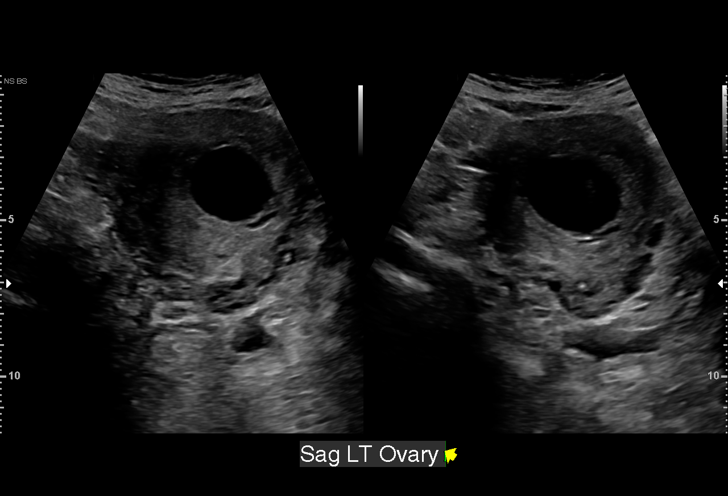
[im 23/23]
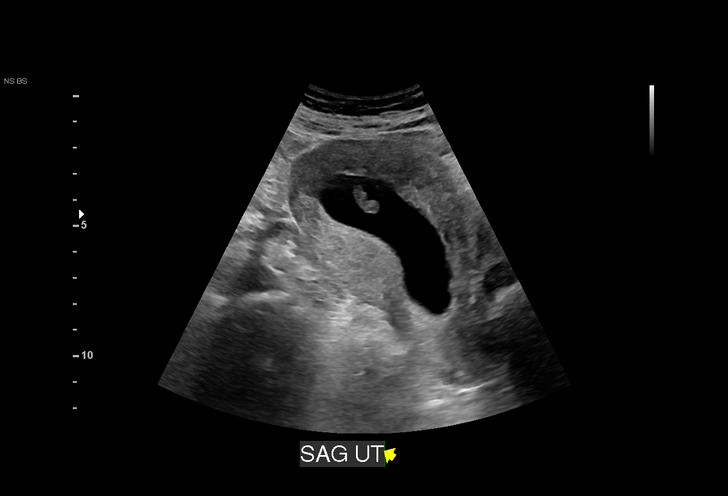

[15 of 23 positions shown; findings below may reference images not displayed]

FINDINGS: Intrauterine gestational sac: Single

Yolk sac:  Not Visualized.

Embryo:  Visualized.

Cardiac Activity: Not Visualized.

CRL:  11.5 mm   7 w   2 d

Subchorionic hemorrhage:  None visualized.

Maternal uterus/adnexae: Bilateral ovaries appear within normal
limits. Corpus luteum noted in the right ovary. No free fluid in the
pelvis.
IMPRESSION: 1. Findings meet definitive criteria for failed intrauterine
pregnancy measuring 7 weeks 2 days by crown-rump length. This
follows SRU consensus guidelines: Diagnostic Criteria for Nonviable
Pregnancy Early in the First Trimester. N Engl J Med

## 2022-03-05 ENCOUNTER — Ambulatory Visit
Admission: RE | Admit: 2022-03-05 | Discharge: 2022-03-05 | Disposition: A | Discharge status: Home / Self Care | Payer: Medicaid Other | Source: Ambulatory Visit | Attending: Internal Medicine | Admitting: Internal Medicine

## 2022-03-05 DIAGNOSIS — Z1231 Encounter for screening mammogram for malignant neoplasm of breast: Secondary | ICD-10-CM

## 2022-04-27 ENCOUNTER — Other Ambulatory Visit: Payer: Self-pay

## 2022-04-27 ENCOUNTER — Emergency Department (HOSPITAL_COMMUNITY)
Admission: EM | Admit: 2022-04-27 | Discharge: 2022-04-28 | Disposition: A | Discharge status: Home / Self Care | Payer: Medicaid Other | Attending: Emergency Medicine | Admitting: Emergency Medicine

## 2022-04-27 DIAGNOSIS — R112 Nausea with vomiting, unspecified: Secondary | ICD-10-CM | POA: Insufficient documentation

## 2022-04-27 DIAGNOSIS — E860 Dehydration: Secondary | ICD-10-CM | POA: Insufficient documentation

## 2022-04-27 LAB — CBG MONITORING, ED: Glucose-Capillary: 93 mg/dL (ref 70–99)

## 2022-04-27 MED ORDER — ONDANSETRON HCL 4 MG/2ML IJ SOLN
4.0000 mg | Freq: Once | INTRAMUSCULAR | Status: AC
  Administered 2022-04-28: 4 mg via INTRAVENOUS
  Filled 2022-04-27: qty 2

## 2022-04-27 NOTE — ED Triage Notes (Signed)
Pt arrives EMS from home with complaints of migraine headaches today. Pt had episode of emesis and then a loss of consciousness. Pt has reported dizziness today and reports a similar episode last year.   Arabic interpreter used [phone]

## 2022-04-27 NOTE — ED Provider Triage Note (Signed)
Emergency Medicine Provider Triage Evaluation Note  Kristen Shaw , a 42 y.o. female  was evaluated in triage.  Pt complains of headache, vomiting and syncope.  She presents via EMS.  States she was at the [employer] and became very nauseous and vomited a large amount and passed out.  She states she has history of migraine headaches and did have a headache today but nothing out of the usual.  She states that she is no longer having nausea stiffness but does have some dizziness  Speaks [place] and interpreter was used for triage/mse   Review of Systems  Positive: Syncope, nausea, vomiting Negative: Chest pain  Physical Exam  BP (!) 157/115 (BP Location: Right Arm)   Pulse 94   Temp 98.3 F (36.8 C) (Oral)   Resp 20   SpO2 99%  Gen:   Awake, no distress   Resp:  Normal effort  MSK:   Moves extremities without difficulty  Other:    Medical Decision Making  Medically screening exam initiated at 11:52 PM.  Appropriate orders placed.  Kristen Shaw was informed that the remainder of the evaluation will be completed by another provider, this initial triage assessment does not replace that evaluation, and the importance of remaining in the ED until their evaluation is complete.     Kristen Shaw, [place] 04/27/22 2353

## 2022-04-28 ENCOUNTER — Emergency Department (HOSPITAL_COMMUNITY): Payer: Medicaid Other

## 2022-04-28 LAB — CBC WITH DIFFERENTIAL/PLATELET
Abs Immature Granulocytes: 0.01 10*3/uL (ref 0.00–0.07)
Basophils Absolute: 0 10*3/uL (ref 0.0–0.1)
Basophils Relative: 1 %
Eosinophils Absolute: 0 10*3/uL (ref 0.0–0.5)
Eosinophils Relative: 1 %
HCT: 40.6 % (ref 36.0–46.0)
Hemoglobin: 12.8 g/dL (ref 12.0–15.0)
Immature Granulocytes: 0 %
Lymphocytes Relative: 27 %
Lymphs Abs: 1.6 10*3/uL (ref 0.7–4.0)
MCH: 26.4 pg (ref 26.0–34.0)
MCHC: 31.5 g/dL (ref 30.0–36.0)
MCV: 83.9 fL (ref 80.0–100.0)
Monocytes Absolute: 0.3 10*3/uL (ref 0.1–1.0)
Monocytes Relative: 6 %
Neutro Abs: 3.8 10*3/uL (ref 1.7–7.7)
Neutrophils Relative %: 65 %
Platelets: 149 10*3/uL — ABNORMAL LOW (ref 150–400)
RBC: 4.84 MIL/uL (ref 3.87–5.11)
RDW: 14.2 % (ref 11.5–15.5)
WBC: 5.9 10*3/uL (ref 4.0–10.5)
nRBC: 0 % (ref 0.0–0.2)

## 2022-04-28 LAB — LIPASE, BLOOD: Lipase: 37 U/L (ref 11–51)

## 2022-04-28 LAB — I-STAT BETA HCG BLOOD, ED (MC, WL, AP ONLY): I-stat hCG, quantitative: <5 m[IU]/mL (ref <5)

## 2022-04-28 LAB — COMPREHENSIVE METABOLIC PANEL
ALT: 14 U/L (ref 0–44)
AST: 23 U/L (ref 15–41)
Albumin: 4 g/dL (ref 3.5–5.0)
Alkaline Phosphatase: 60 U/L (ref 38–126)
Anion gap: 7 (ref 5–15)
BUN: 12 mg/dL (ref 6–20)
CO2: 25 mmol/L (ref 22–32)
Calcium: 8.4 mg/dL — ABNORMAL LOW (ref 8.9–10.3)
Chloride: 101 mmol/L (ref 98–111)
Creatinine, Ser: 0.7 mg/dL (ref 0.44–1.00)
GFR, Estimated: >60 mL/min (ref >60)
Glucose, Bld: 115 mg/dL — ABNORMAL HIGH (ref 70–99)
Potassium: 3.7 mmol/L (ref 3.5–5.1)
Sodium: 133 mmol/L — ABNORMAL LOW (ref 135–145)
Total Bilirubin: 0.6 mg/dL (ref 0.3–1.2)
Total Protein: 8.4 g/dL — ABNORMAL HIGH (ref 6.5–8.1)

## 2022-04-28 LAB — TROPONIN I (HIGH SENSITIVITY): Troponin I (High Sensitivity): 13 ng/L (ref <18)

## 2022-04-28 MED ORDER — ONDANSETRON HCL 4 MG PO TABS: 4.0000 mg | ORAL_TABLET | Freq: Four times a day (QID) | ORAL | 0 refills | Status: DC | PRN

## 2022-04-28 MED ORDER — LACTATED RINGERS IV BOLUS
1000.0000 mL | Freq: Once | INTRAVENOUS | Status: AC
  Administered 2022-04-28: 1000 mL via INTRAVENOUS

## 2022-04-28 NOTE — ED Notes (Signed)
Pt c/o dizzness and emesis starting last night.

## 2022-04-28 NOTE — Discharge Instructions (Addendum)
You were seen today for nausea/vomitting/passing out. We did not identify any emergent cause for your symptoms. Your evaluation is most consistent with recurrent biliary episode.   Plan and next steps:   The following may be helpful in managing your symptoms:   Pain- Lidocaine Patches  Apply to affected area for up to 12 hours at a time.   Pain/Fever- Adult Tylenol dosing:  650 mg orally every 4 to 6 hours as needed, MAX: 3250 mg/24 hours   (Extra-strength) 1000 mg orally every 6 hours as needed; MAX: 3000 mg/24 hours   Do not use if you have liver disease. Read the label on the bottle.   Pain/Fever- Adult Ibuprofen Dosing  200 to 400 mg orally every 4 to 6 hours as needed; MAX 1200 mg/day; do not take longer than 10 days   Do not use if you have kidney disease. Read the label on the bottle   Gastritis/Heartburn- Omeprazole Take 20mg  Daily for 14 days  Nausea- You have been prescribed Zofran Take 4mg  by mouth as needed every 6 hours approximately 30 minutes before eating.  Findings:  You may see all of your lab and imaging results utilizing our online portal! Look in this document or ask a team member for your mychart* access information. The most notable results have additionally been verbally communicated with you and your bedside family.    Follow-up Plan:   Follow up with the patient's normal primary care provider for monitoring of this condition within 48 hours.   Signs/Symptoms that would warrant return to the ED:  Please return to the ED if you experience worsening of symptoms or any abrupt changes in your health. Standard of care precautions for your chief complaint have already been verbally communicated with you. Always be on alert for fevers, chills, shortness of breath, chest pains, or sudden changes that warrant immediate evaluation.    Thank you for allowing [place] to be a part of you and your families' care.   Tretha Sciara MD

## 2022-04-28 NOTE — ED Provider Notes (Signed)
[facility] Provider Note   CSN: [id number] Arrival date & time: 04/27/22  2332     History Chief Complaint  Patient presents with   Emesis   Loss of Consciousness   An Arabic interpreter was used for the entirety of the below history of present illness/ MDM  HPI Kristen Shaw is a 42 y.o. female presenting for chief complaint of vomiting and syncope.  States that she has a history of cholelithiasis but has not been able to schedule follow-up with the surgeon.  States she supposed to see her PCP later this week.  Last night she states she had a "gallbladder attack". Symptoms grossly resolved this morning.  She denies fevers or chills, nausea vomiting at this time, chest pain or shortness of breath.  She is otherwise ambulatory tolerating p.o. intake this morning. States that last night while she was vomiting she collapsed to the ground. Was able to get herself back up and go to bed without difficulty.  Wanted to be checked out this morning to make sure there is nothing else going on.  Patient's recorded medical, surgical, social, medication list and allergies were reviewed in the Snapshot window as part of the initial history.   Review of Systems   Review of Systems  Constitutional:  Negative for chills and fever.  HENT:  Negative for ear pain and sore throat.   Eyes:  Negative for pain and visual disturbance.  Respiratory:  Negative for cough and shortness of breath.   Cardiovascular:  Negative for chest pain and palpitations.  Gastrointestinal:  Positive for nausea and vomiting. Negative for abdominal pain.  Genitourinary:  Negative for dysuria and hematuria.  Musculoskeletal:  Negative for arthralgias and back pain.  Skin:  Negative for color change and rash.  Neurological:  Positive for syncope. Negative for seizures.  All other systems reviewed and are negative.   Physical Exam Updated Vital Signs BP 124/83   Pulse 79    Temp 98 F (36.7 C) (Oral)   Resp (!) 22   LMP 04/16/2022 (Approximate)   SpO2 99%  Physical Exam Vitals and nursing note reviewed.  Constitutional:      General: She is not in acute distress.    Appearance: She is well-developed.  HENT:     Head: Normocephalic and atraumatic.  Eyes:     Conjunctiva/sclera: Conjunctivae normal.  Cardiovascular:     Rate and Rhythm: Normal rate and regular rhythm.     Heart sounds: No murmur heard. Pulmonary:     Effort: Pulmonary effort is normal. No respiratory distress.     Breath sounds: Normal breath sounds.  Abdominal:     Palpations: Abdomen is soft.     Tenderness: There is no abdominal tenderness.  Musculoskeletal:        General: No swelling.     Cervical back: Neck supple.  Skin:    General: Skin is warm and dry.     Capillary Refill: Capillary refill takes less than 2 seconds.  Neurological:     Mental Status: She is alert.  Psychiatric:        Mood and Affect: Mood normal.      ED Course/ Medical Decision Making/ A&P Clinical Course as of 04/28/22 1145  Wed Apr 28, 2022  0845 Vomit and emesis last night.  [CC]    Clinical Course User Index [CC] Tretha Sciara, MD    Procedures Procedures   Medications Ordered in ED Medications  ondansetron (  ZOFRAN) injection 4 mg (4 mg Intravenous Given 04/28/22 0005)  lactated ringers bolus 1,000 mL ( Intravenous Restarted 04/28/22 0955)    Medical Decision Making:    Kristen Shaw is a 42 y.o. female who presented to the ED today with syncopal episode detailed above.     Patient's presentation is complicated by their history of known cholelithiasis, social determinants of health including language barrier.  Patient placed on continuous vitals and telemetry monitoring while in ED which was reviewed periodically.   Complete initial physical exam performed, notably the patient  was hemodynamically stable in no acute distress.      Reviewed and confirmed nursing  documentation for past medical history, family history, social history.    Initial Assessment:   This is most consistent with an acute life/limb threatening illness complicated by underlying chronic conditions. Patient's nausea and vomiting are most likely to be related to her underlying cholelithiasis with her current episode.  Currently she is asymptomatic in no acute distress.  She is afebrile.  Seems grossly inconsistent with cholecystitis at this time given resolution.  More consistent with symptomatic cholelithiasis now improving. Considered appendicitis, cholecystitis, small bowel obstruction however the seem grossly less likely. Furthermore, patient syncope likely vasovagal versus orthostatic given prodrome of vomiting. Initial Plan:  Screening labs including CBC and Metabolic panel to evaluate for infectious or metabolic etiology of disease. Lipase to evaluate for pancreatitis Pregnancy test due to patient's history of severe nausea vomiting with prior pregnancy CXR to evaluate for structural/infectious intrathoracic pathology.  Troponin/EKG to evaluate for cardiac pathology. Objective evaluation as below reviewed with plan for close reassessment after 1 L IVF and antiemetics  Initial Study Results:   Laboratory  All laboratory results reviewed without evidence of clinically relevant pathology.   EKG EKG was reviewed independently. Rate, rhythm, axis, intervals all examined and without medically relevant abnormality. ST segments without concerns for elevations.    Radiology  All images reviewed independently. Agree with radiology report at this time.   DG Chest Portable 1 View  Result Date: 04/28/2022 CLINICAL DATA:  Nausea, vomiting, headache, syncope EXAM: PORTABLE CHEST 1 VIEW COMPARISON:  Portable exam 0857 hours without priors for comparison FINDINGS: Diaphyseal loosening. Upper normal heart size, likely accentuated by technique. Mediastinal contours and pulmonary vascularity  normal. Lungs clear. No acute infiltrate, pleural effusion, pneumothorax, or osseous abnormality. IMPRESSION: No acute abnormalities. Electronically Signed   By: Lavonia Dana M.D.   On: 04/28/2022 09:02     Final Assessment and Plan:   Symptomatically resolved on reassessment. No acute pathology identified, likely recurrence of her biliary colic without cholestasis or cholangitis.  Recommended     Disposition:  I have considered need for hospitalization, however, considering all of the above, I believe this patient is stable for discharge at this time.  Patient/family educated about specific return precautions for given chief complaint and symptoms.  Patient/family educated about follow-up with PCP and General Surgery.     Patient/family expressed understanding of return precautions and need for follow-up. Patient spoken to regarding all imaging and laboratory results and appropriate follow up for these results. All education provided in verbal form with additional information in written form. Time was allowed for answering of patient questions. Patient discharged.    Emergency Department Medication Summary:   Medications  ondansetron [facility]) injection 4 mg (4 mg Intravenous Given 04/28/22 0005)  lactated ringers bolus 1,000 mL ( Intravenous Restarted 04/28/22 0955)    Clinical Impression:  1. Dehydration   2.  Nausea and vomiting, unspecified vomiting type      Discharge   Final Clinical Impression(s) / ED Diagnoses Final diagnoses:  Dehydration  Nausea and vomiting, unspecified vomiting type    Rx / DC Orders ED Discharge Orders          Ordered    ondansetron (ZOFRAN) 4 MG tablet  Every 6 hours PRN        04/28/22 1128              Glyn Ade, MD 04/28/22 1145

## 2022-04-28 NOTE — ED Notes (Signed)
Pt report emesis on the top she wore into department and she didn't wear shoes.  Pt offered to wait for daughter in lobby in a gown.  Pt provided w/ a paper top and non-skid socks instead.  Pt shown to lobby.

## 2022-06-15 IMAGING — US US ABDOMEN LIMITED
1 series · 14 of 25 positions shown · non-contrast
Comparison: None.

CLINICAL DATA: Abdominal pain

EXAM:
ULTRASOUND ABDOMEN LIMITED RIGHT UPPER QUADRANT

[Series 1: us abdomen limited ruq (liver/gb) · 14 of 67 slices shown]
[im 1/67]
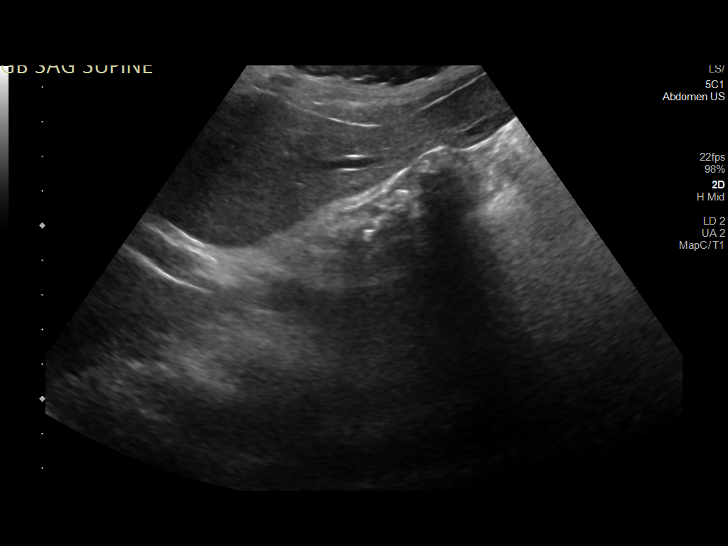
[im 6/67]
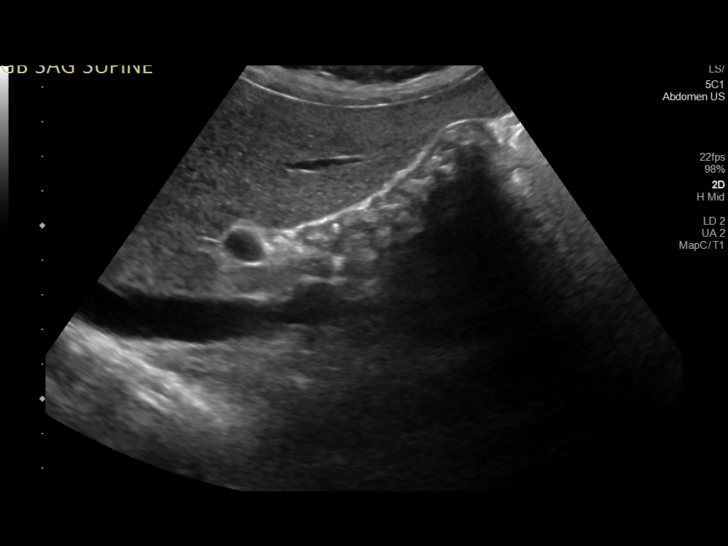
[im 12/67]
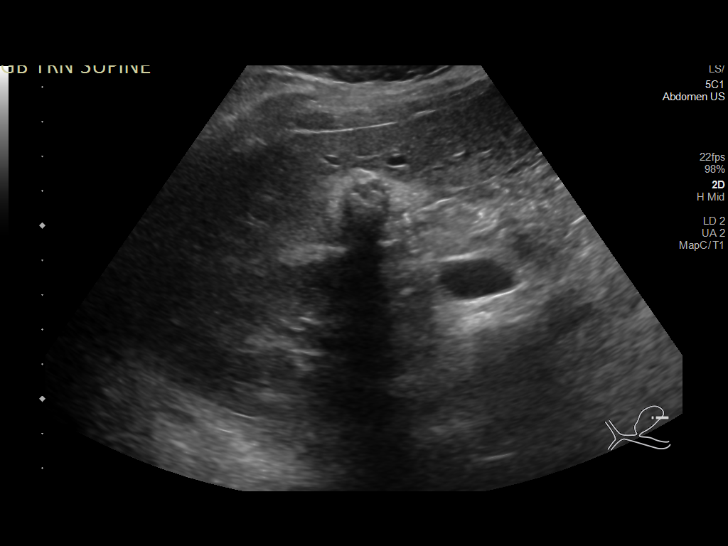
[im 17/67]
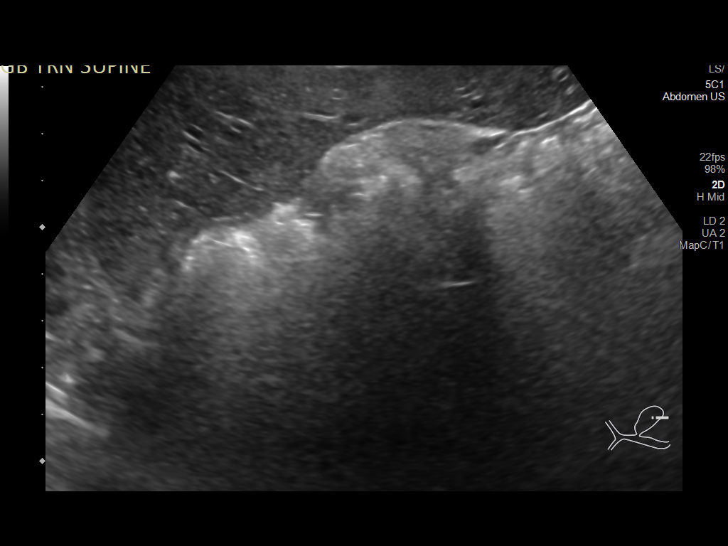
[im 23/67]
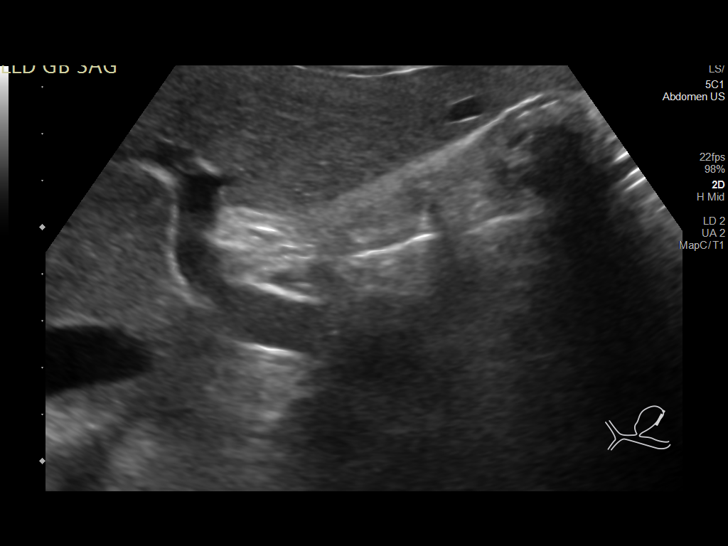
[im 25/67]
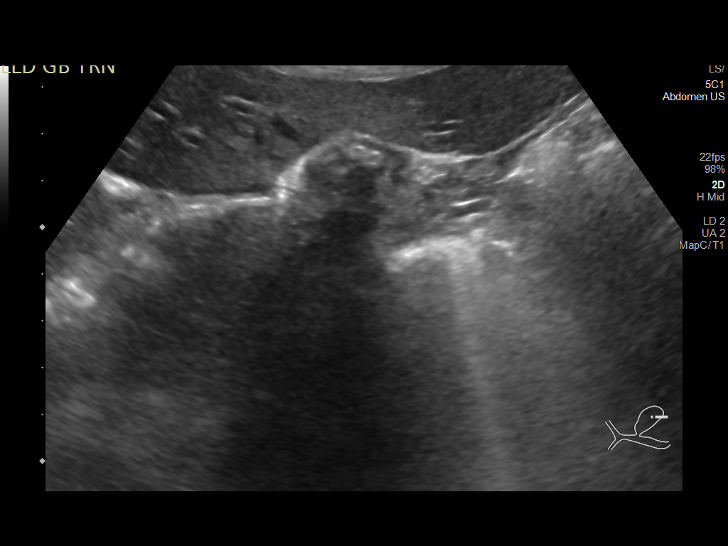
[im 31/67]
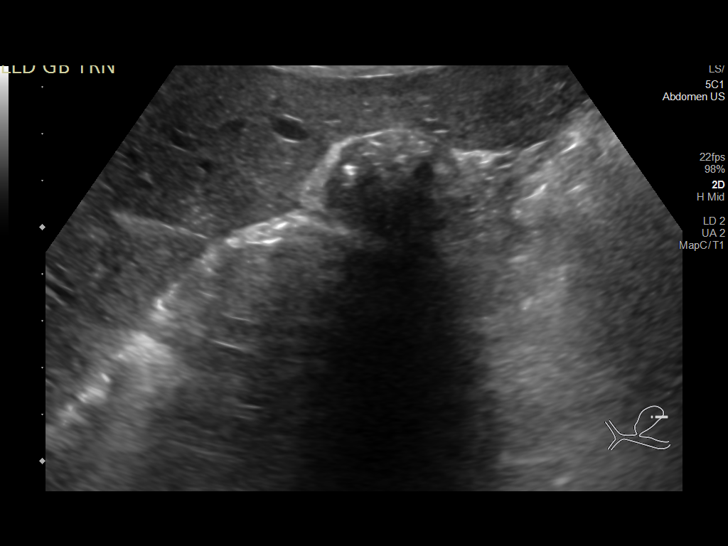
[im 36/67]
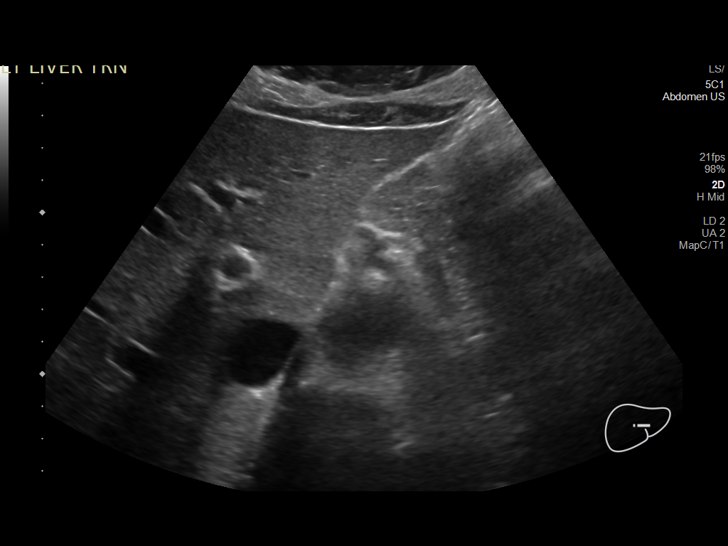
[im 42/67]
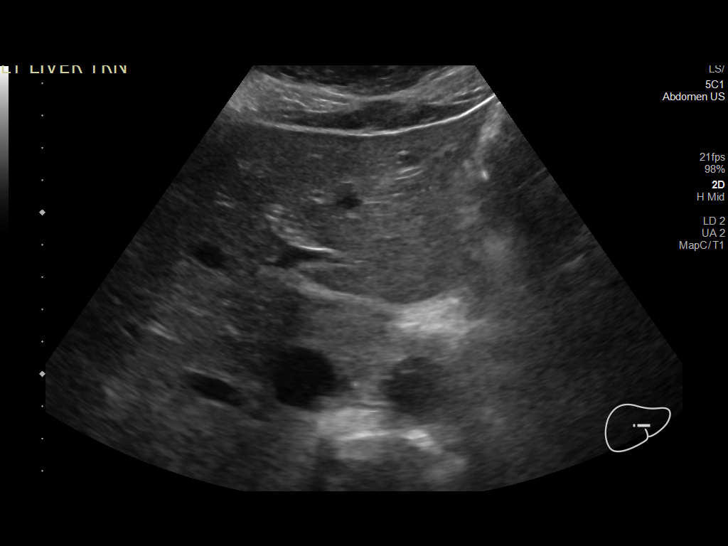
[im 45/67]
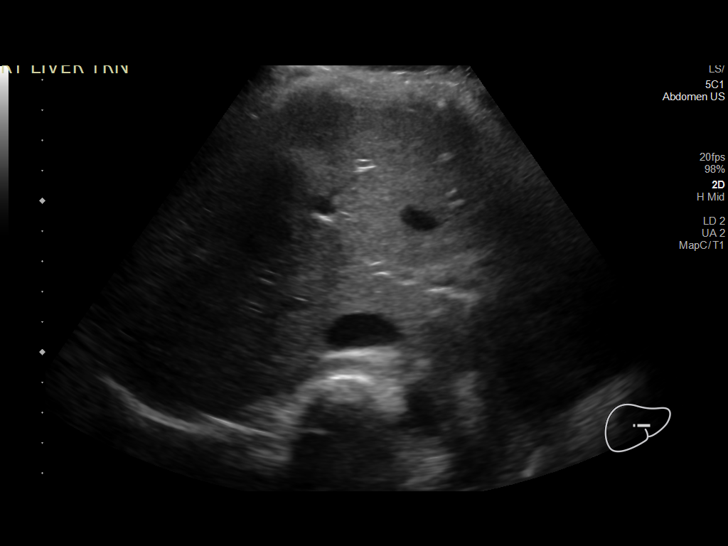
[im 50/67]
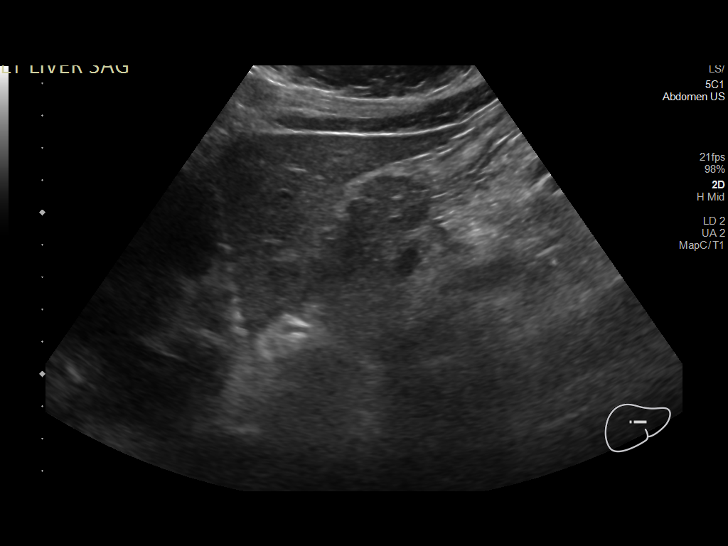
[im 56/67]
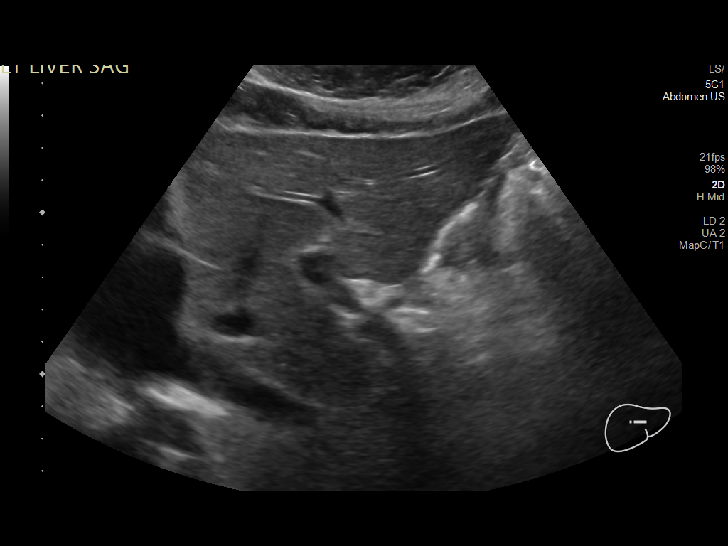
[im 61/67]
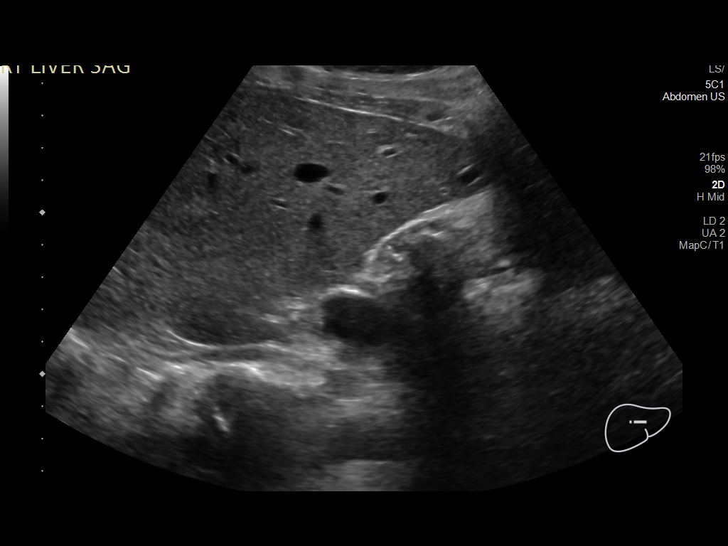
[im 67/67]
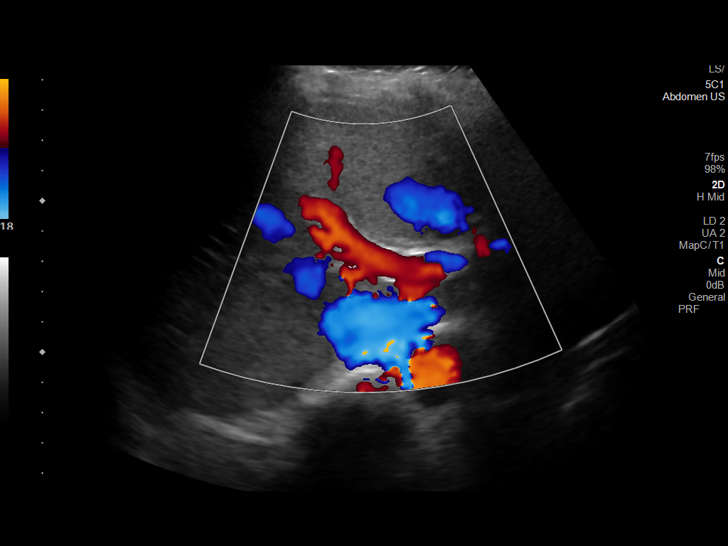

[14 of 25 positions shown; findings below may reference images not displayed]

FINDINGS: Gallbladder:

Gallbladder is partially distended and filled with multiple stones.
No wall thickening or pericholecystic fluid is noted. Negative
sonographic Murphy's sign is elicited.

Common bile duct:

Diameter: 3.2 mm.

Liver:

No focal lesion identified. Within normal limits in parenchymal
echogenicity. Portal vein is patent on color Doppler imaging with
normal direction of blood flow towards the liver.

Other: None.
IMPRESSION: Multiple gallstones without complicating factors.

## 2022-08-11 ENCOUNTER — Ambulatory Visit: Payer: Medicaid Other | Admitting: Obstetrics & Gynecology

## 2022-08-11 NOTE — Congregational Nurse Program (Signed)
  Dept: [phone]   Congregational Nurse Program Note  Date of Encounter: 08/11/2022  Past Medical History: Past Medical History:  Diagnosis Date   Choledocholithiasis with acute cholecystitis    Migraines     Encounter Details:  CNP Questionnaire - 08/11/22 1320       Questionnaire   Ask client: Do you give verbal consent for me to treat you today? Yes    Student Assistance N/A    Location Patient Served  [facility]    Visit Setting with Client Organization    Patient Status Refugee    Insurance Medicaid    Insurance/Financial Assistance Referral Medicaid    Medication Have Medication Insecurities    Medical Provider Yes    Screening Referrals Made N/A    Medical Referrals Made N/A    Medical Appointment Made N/A    Recently w/o PCP, now 1st time PCP visit completed due to CNs referral or appointment made N/A    Food N/A    Transportation N/A    Housing/Utilities N/A    Interpersonal Safety N/A    Interventions Advocate/Support;Navigate Healthcare System;Case Management;Counsel;Educate    Abnormal to Normal Screening Since Last CN Visit N/A    Screenings CN Performed N/A    Sent Client to Lab for: N/A    Did client attend any of the following based off CNs referral or appointments made? N/A    ED Visit Averted Yes    Life-Saving Intervention Made N/A            patient came requesting clarification with gall bladder surgery scheduling. She states that she was asked to $2000 prior to scheduling.  I have called [facility] who stated that her insurance is not within network. That explained the high out of pocket money needed. Patient stated that she cannot afford . She was presented with financial assistance options.I have requested patient to change to insurance that is within network.Also to go to ED if symptoms persist.  Arman Bogus RN BSN PCCN  [facility] [phone]-cell [phone]-office

## 2022-08-18 ENCOUNTER — Encounter: Payer: Self-pay | Admitting: Family Medicine

## 2022-08-18 ENCOUNTER — Other Ambulatory Visit: Payer: Self-pay

## 2022-08-18 ENCOUNTER — Ambulatory Visit (INDEPENDENT_AMBULATORY_CARE_PROVIDER_SITE_OTHER): Payer: Medicaid Other | Admitting: Family Medicine

## 2022-08-18 VITALS — BP 128/88 | HR 74 | Wt 152.5 lb

## 2022-08-18 DIAGNOSIS — Z3009 Encounter for other general counseling and advice on contraception: Secondary | ICD-10-CM | POA: Diagnosis not present

## 2022-08-18 DIAGNOSIS — Z30017 Encounter for initial prescription of implantable subdermal contraceptive: Secondary | ICD-10-CM

## 2022-08-18 LAB — POCT PREGNANCY, URINE: Preg Test, Ur: NEGATIVE

## 2022-08-18 MED ORDER — ETONOGESTREL 68 MG ~~LOC~~ IMPL
68.0000 mg | DRUG_IMPLANT | Freq: Once | SUBCUTANEOUS | Status: AC
Start: 2022-08-18 — End: 2022-08-18
  Administered 2022-08-18: 68 mg via SUBCUTANEOUS

## 2022-08-24 NOTE — Progress Notes (Signed)
    Contraception/Family Planning VISIT ENCOUNTER NOTE  Subjective:   Kristen Shaw is a 42 y.o. [id number] female here for reproductive life counseling.  Desires reliable and effecticacy from [facility].  Reports she does not want a pregnancy in the next year. Denies abnormal vaginal bleeding, discharge, pelvic pain, problems with intercourse or other gynecologic concerns.    Gynecologic History Patient's last menstrual period was 07/14/2022 (exact date). Contraception: none  Health Maintenance Due  Topic Date Due   COVID-19 Vaccine (1) Never done     The following portions of the patient's history were reviewed and updated as appropriate: allergies, current medications, past family history, past medical history, past social history, past surgical history and problem list.  Review of Systems Pertinent items are noted in HPI.   Objective:  BP 128/88   Pulse 74   Wt 152 lb 8 oz (69.2 kg)   LMP 07/14/2022 (Exact Date)   Breastfeeding No   BMI 24.61 kg/m  Gen: well appearing, NAD HEENT: no scleral icterus CV: RR Lung: Normal WOB Ext: warm well perfused  PELVIC:deferred  Nexplanon Insertion Procedure Patient identified, informed consent performed, consent signed.   Patient does understand that irregular bleeding is a very common side effect of this medication. She was advised to have backup contraception for one week after placement. Pregnancy test in clinic today was negative.  Appropriate time out taken.  Patient's left arm was prepped and draped in the usual sterile fashion.. The ruler used to measure and mark insertion area.  Patient was prepped with alcohol swab and then injected with 3 ml of 1% lidocaine.  She was prepped with betadine, Nexplanon removed from packaging,  Device confirmed in needle, then inserted full length of needle and withdrawn per handbook instructions. Nexplanon was able to palpated in the patient's arm; patient palpated the insert herself. There was minimal  blood loss.  Patient insertion site covered with guaze and a pressure bandage to reduce any bruising.  The patient tolerated the procedure well and was given post procedure instructions.    Assessment and Plan:   Contraception counseling: Reviewed all forms of birth control options available including abstinence; over the counter/barrier methods; hormonal contraceptive medication including pill, patch, ring, injection,contraceptive implant; hormonal and nonhormonal IUDs; permanent sterilization options including vasectomy and the various tubal sterilization modalities. Risks and benefits reviewed.  Questions were answered.  Written information was also given to the patient to review.  Patient desires Nexpanon, this was prescribed for patient. She will follow up in  PRN for surveillance.  She was told to call with any further questions, or with any concerns about this method of contraception.  Emphasized use of condoms 100% of the time for STI prevention.  1. Nexplanon insertion - Pregnancy, urine POC - etonogestrel (NEXPLANON) implant 68 mg    Please refer to After Visit Summary for other counseling recommendations.   No follow-ups on file.  Federico Flake, MD, MPH, ABFM Attending Physician Faculty Practice- [facility]

## 2022-09-23 IMAGING — US US ABDOMEN LIMITED
1 series · 14 of 25 positions shown · non-contrast
Comparison: Ultrasound dated 06/11/2021.

CLINICAL DATA: Right upper quadrant abdominal pain.

EXAM:
ULTRASOUND ABDOMEN LIMITED RIGHT UPPER QUADRANT

[Series 1: us abdomen limited ruq (liver/gb) · 14 of 95 slices shown]
[im 1/95]
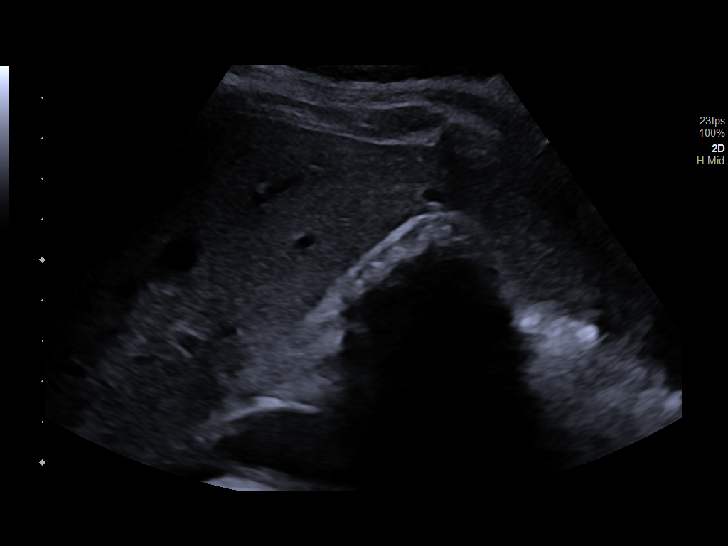
[im 8/95]
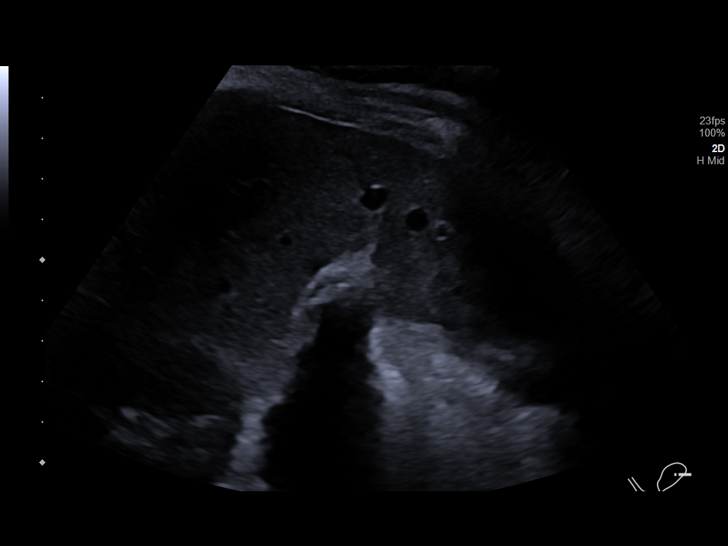
[im 16/95]
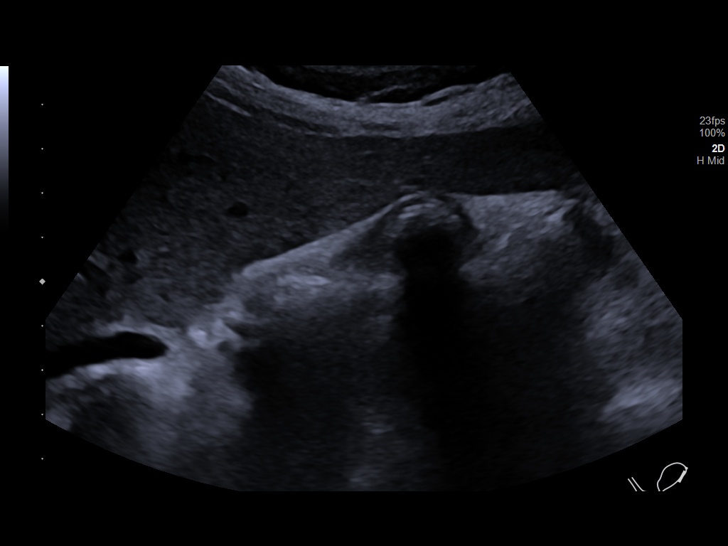
[im 24/95]
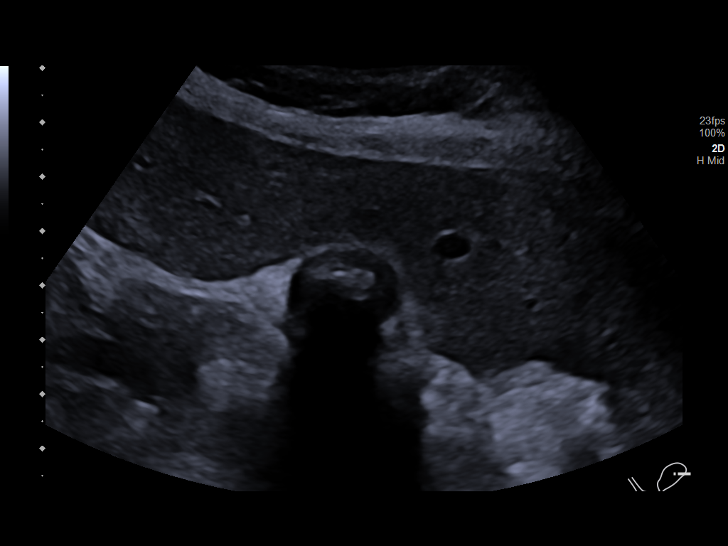
[im 32/95]
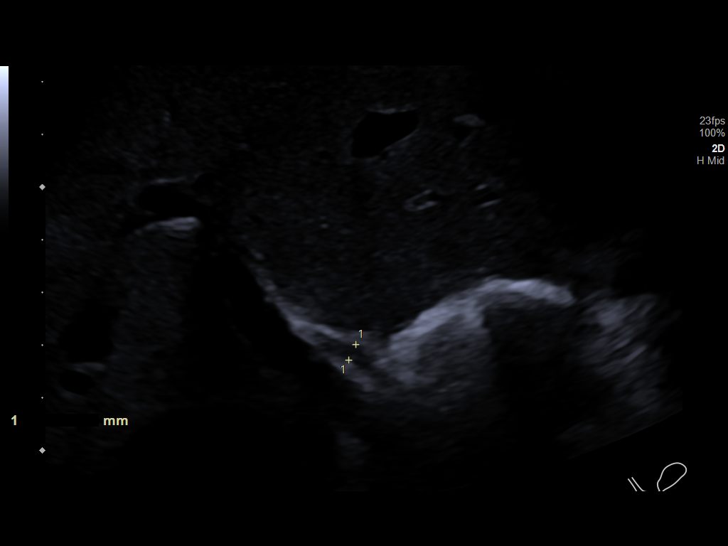
[im 36/95]
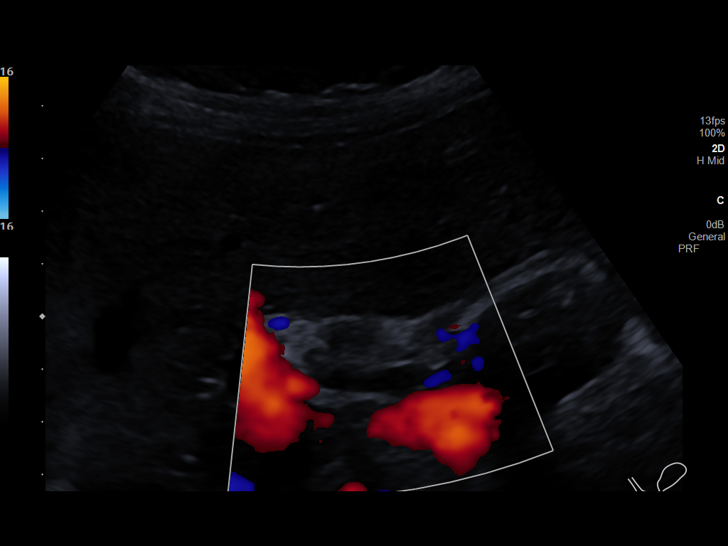
[im 44/95]
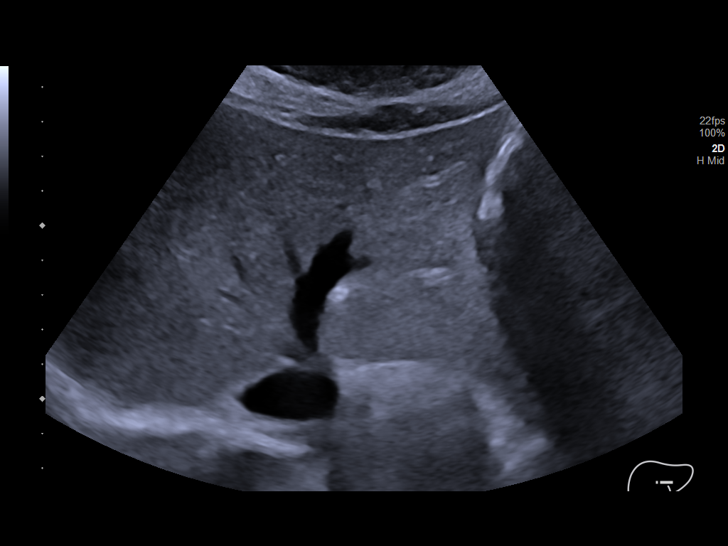
[im 51/95]
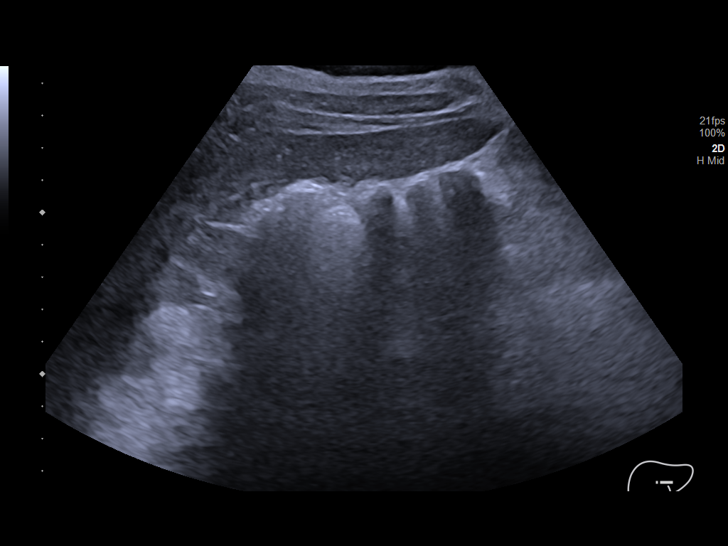
[im 59/95]
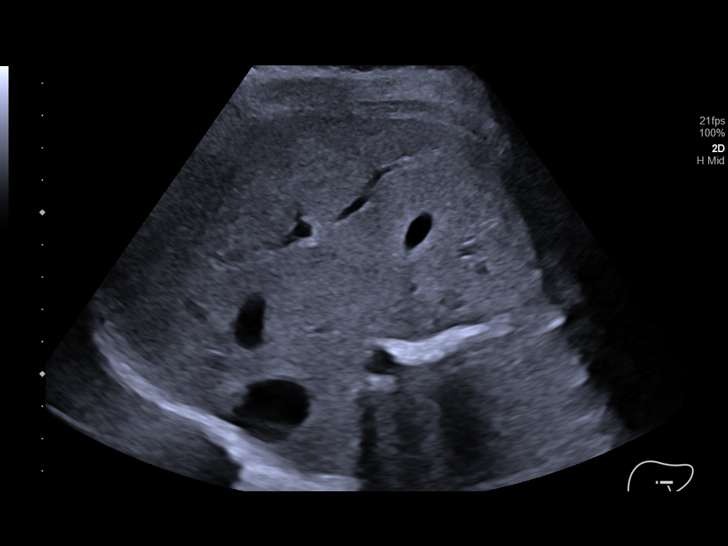
[im 63/95]
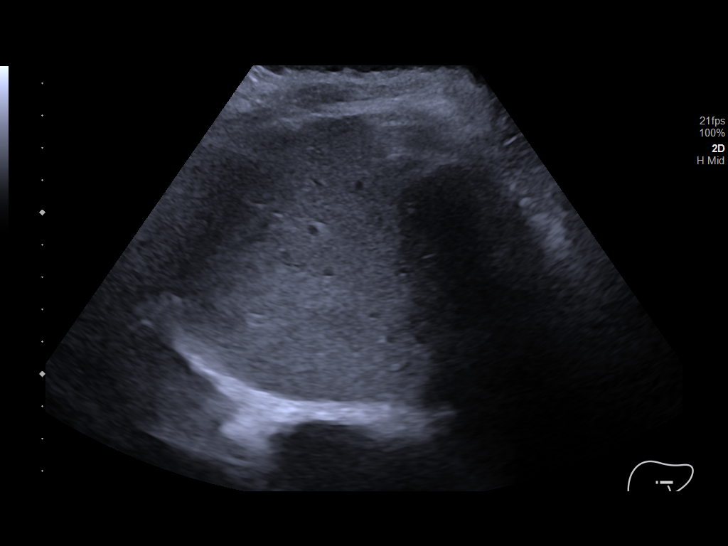
[im 71/95]
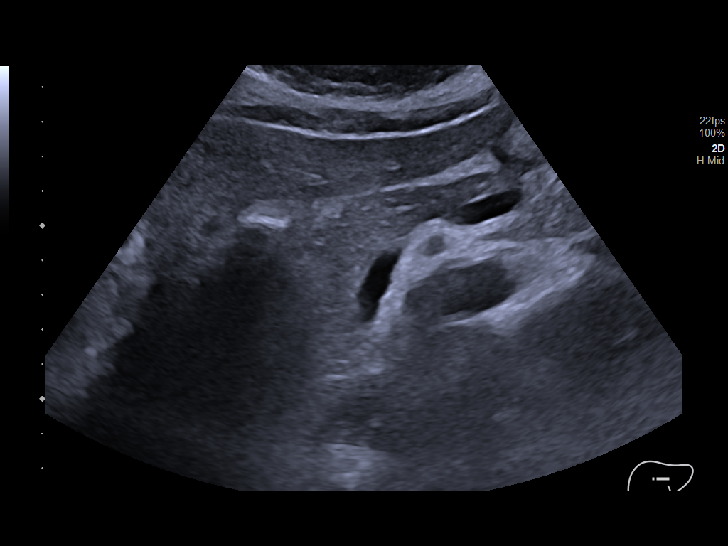
[im 79/95]
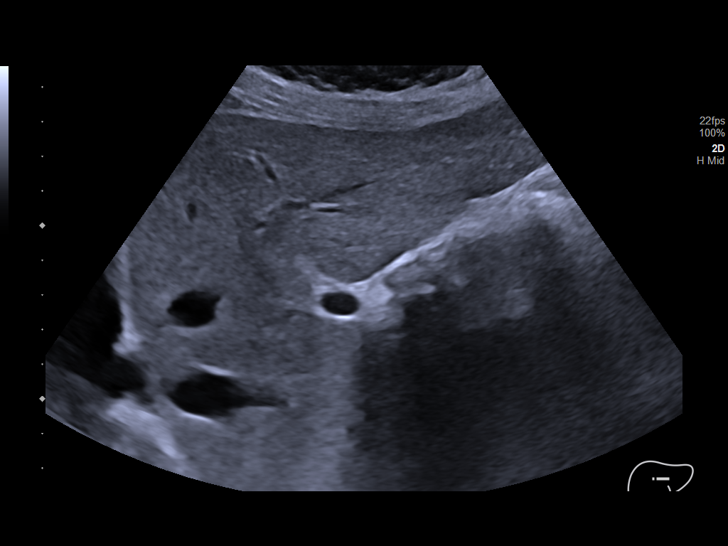
[im 87/95]
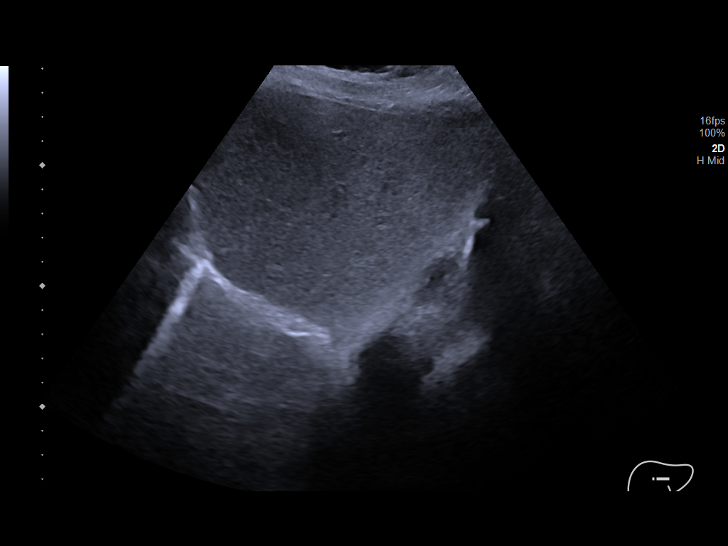
[im 95/95]
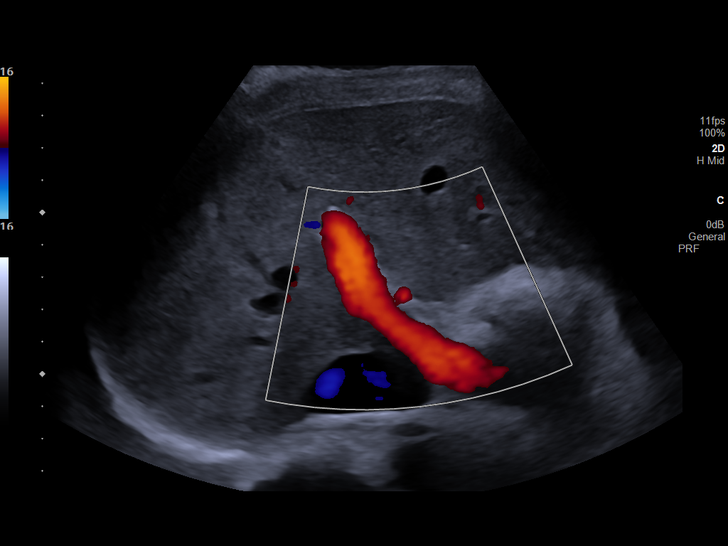

[14 of 25 positions shown; findings below may reference images not displayed]

FINDINGS: Gallbladder:

The gallbladder is filled with stones. No gallbladder wall
thickening or pericholecystic fluid. Negative sonographic Murphy's
sign. Evaluation for sonographic Murphy's sign however was limited
as the patient was pre-medicated for pain.

Common bile duct:

Diameter: 5 mm

Liver:

No focal lesion identified. Within normal limits in parenchymal
echogenicity. Portal vein is patent on color Doppler imaging with
normal direction of blood flow towards the liver.

Other: None.
IMPRESSION: Cholelithiasis without sonographic evidence of acute cholecystitis.

## 2024-05-17 ENCOUNTER — Ambulatory Visit: Admitting: Neurology
